# Patient Record
Sex: Male | Born: 1980 | Race: White | Hispanic: No | Marital: Single | Smoking: Current every day smoker
Health system: Southern US, Community
[De-identification: ages and names within clinical notes are randomized; demographics above are authoritative.]

## PROBLEM LIST (undated history)

## (undated) DIAGNOSIS — N289 Disorder of kidney and ureter, unspecified: Secondary | ICD-10-CM

---

## 2004-10-11 ENCOUNTER — Emergency Department: Payer: Self-pay | Admitting: Emergency Medicine

## 2005-02-15 ENCOUNTER — Emergency Department: Payer: Self-pay | Admitting: Emergency Medicine

## 2005-02-22 ENCOUNTER — Emergency Department: Payer: Self-pay | Admitting: Emergency Medicine

## 2005-04-01 ENCOUNTER — Emergency Department: Payer: Self-pay | Admitting: General Practice

## 2005-04-22 ENCOUNTER — Emergency Department: Payer: Self-pay | Admitting: General Practice

## 2005-05-17 ENCOUNTER — Emergency Department: Payer: Self-pay | Admitting: Internal Medicine

## 2005-05-19 ENCOUNTER — Emergency Department: Payer: Self-pay | Admitting: Emergency Medicine

## 2005-05-23 ENCOUNTER — Emergency Department: Payer: Self-pay | Admitting: Emergency Medicine

## 2005-06-16 ENCOUNTER — Emergency Department: Payer: Self-pay | Admitting: Emergency Medicine

## 2005-07-09 ENCOUNTER — Emergency Department: Payer: Self-pay | Admitting: Unknown Physician Specialty

## 2005-07-30 ENCOUNTER — Emergency Department: Payer: Self-pay | Admitting: Emergency Medicine

## 2005-08-06 ENCOUNTER — Emergency Department: Payer: Self-pay | Admitting: General Practice

## 2005-08-19 ENCOUNTER — Emergency Department: Payer: Self-pay | Admitting: Emergency Medicine

## 2005-09-16 ENCOUNTER — Emergency Department: Payer: Self-pay | Admitting: Emergency Medicine

## 2005-09-22 ENCOUNTER — Emergency Department: Payer: Self-pay | Admitting: Emergency Medicine

## 2005-11-11 ENCOUNTER — Emergency Department: Payer: Self-pay | Admitting: Emergency Medicine

## 2006-01-26 ENCOUNTER — Emergency Department: Payer: Self-pay | Admitting: Emergency Medicine

## 2006-02-07 ENCOUNTER — Emergency Department: Payer: Self-pay | Admitting: Emergency Medicine

## 2006-02-09 ENCOUNTER — Emergency Department: Payer: Self-pay | Admitting: Emergency Medicine

## 2007-04-16 ENCOUNTER — Emergency Department: Payer: Self-pay | Admitting: Emergency Medicine

## 2007-05-07 ENCOUNTER — Emergency Department: Payer: Self-pay | Admitting: General Practice

## 2007-05-12 ENCOUNTER — Emergency Department: Payer: Self-pay | Admitting: Emergency Medicine

## 2008-05-17 ENCOUNTER — Emergency Department: Payer: Self-pay | Admitting: Emergency Medicine

## 2008-05-19 ENCOUNTER — Emergency Department: Payer: Self-pay | Admitting: Emergency Medicine

## 2008-08-08 ENCOUNTER — Emergency Department: Payer: Self-pay | Admitting: Emergency Medicine

## 2008-10-20 ENCOUNTER — Emergency Department: Payer: Self-pay | Admitting: Emergency Medicine

## 2008-12-03 ENCOUNTER — Emergency Department: Payer: Self-pay | Admitting: Emergency Medicine

## 2008-12-30 ENCOUNTER — Emergency Department: Payer: Self-pay | Admitting: Emergency Medicine

## 2009-03-04 ENCOUNTER — Emergency Department: Payer: Self-pay | Admitting: Emergency Medicine

## 2009-03-09 ENCOUNTER — Emergency Department: Payer: Self-pay | Admitting: Emergency Medicine

## 2009-03-12 ENCOUNTER — Emergency Department: Payer: Self-pay | Admitting: Emergency Medicine

## 2009-05-18 ENCOUNTER — Emergency Department: Payer: Self-pay | Admitting: Emergency Medicine

## 2009-07-22 ENCOUNTER — Emergency Department: Payer: Self-pay | Admitting: Emergency Medicine

## 2009-10-13 ENCOUNTER — Emergency Department: Payer: Self-pay | Admitting: Emergency Medicine

## 2009-10-19 ENCOUNTER — Emergency Department: Payer: Self-pay | Admitting: Emergency Medicine
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
## 2010-01-17 ENCOUNTER — Emergency Department: Payer: Self-pay | Admitting: Emergency Medicine

## 2010-07-06 ENCOUNTER — Emergency Department: Payer: Self-pay | Admitting: Emergency Medicine

## 2010-10-22 ENCOUNTER — Emergency Department: Payer: Self-pay | Admitting: Unknown Physician Specialty

## 2010-11-04 ENCOUNTER — Emergency Department: Payer: Self-pay | Admitting: Unknown Physician Specialty

## 2010-11-26 ENCOUNTER — Emergency Department: Payer: Self-pay | Admitting: Emergency Medicine

## 2010-12-25 ENCOUNTER — Emergency Department: Payer: Self-pay | Admitting: Emergency Medicine

## 2013-01-05 ENCOUNTER — Emergency Department: Payer: Self-pay | Admitting: Emergency Medicine

## 2013-02-01 ENCOUNTER — Emergency Department: Payer: Self-pay | Admitting: Internal Medicine

## 2013-04-29 ENCOUNTER — Emergency Department (HOSPITAL_COMMUNITY): Payer: Self-pay

## 2013-04-29 ENCOUNTER — Encounter (HOSPITAL_COMMUNITY): Payer: Self-pay

## 2013-04-29 ENCOUNTER — Emergency Department (HOSPITAL_COMMUNITY)
Admission: EM | Admit: 2013-04-29 | Discharge: 2013-04-29 | Disposition: A | Discharge status: Home / Self Care | Payer: Self-pay | Attending: Emergency Medicine | Admitting: Emergency Medicine

## 2013-04-29 DIAGNOSIS — M549 Dorsalgia, unspecified: Secondary | ICD-10-CM

## 2013-04-29 DIAGNOSIS — M545 Low back pain, unspecified: Secondary | ICD-10-CM | POA: Insufficient documentation

## 2013-04-29 DIAGNOSIS — R109 Unspecified abdominal pain: Secondary | ICD-10-CM | POA: Insufficient documentation

## 2013-04-29 DIAGNOSIS — R300 Dysuria: Secondary | ICD-10-CM | POA: Insufficient documentation

## 2013-04-29 DIAGNOSIS — R110 Nausea: Secondary | ICD-10-CM | POA: Insufficient documentation

## 2013-04-29 DIAGNOSIS — Z87442 Personal history of urinary calculi: Secondary | ICD-10-CM | POA: Insufficient documentation

## 2013-04-29 HISTORY — DX: Disorder of kidney and ureter, unspecified: N28.9

## 2013-04-29 LAB — CBC WITH DIFFERENTIAL/PLATELET
Hemoglobin: 16.4 g/dL (ref 13.0–17.0)
Lymphocytes Relative: 31 % (ref 12–46)
Lymphs Abs: 2.7 10*3/uL (ref 0.7–4.0)
MCH: 32 pg (ref 26.0–34.0)
Monocytes Relative: 5 % (ref 3–12)
Neutro Abs: 5.1 10*3/uL (ref 1.7–7.7)
Neutrophils Relative %: 60 % (ref 43–77)
RBC: 5.13 MIL/uL (ref 4.22–5.81)
WBC: 8.6 10*3/uL (ref 4.0–10.5)

## 2013-04-29 LAB — BASIC METABOLIC PANEL
BUN: 18 mg/dL (ref 6–23)
CO2: 29 mEq/L (ref 19–32)
Chloride: 97 mEq/L (ref 96–112)
Glucose, Bld: 103 mg/dL — ABNORMAL HIGH (ref 70–99)
Potassium: 4 mEq/L (ref 3.5–5.1)

## 2013-04-29 LAB — URINALYSIS, ROUTINE W REFLEX MICROSCOPIC
Bilirubin Urine: NEGATIVE
Leukocytes, UA: NEGATIVE
Nitrite: NEGATIVE
Specific Gravity, Urine: 1.02 (ref 1.005–1.030)
pH: 7 (ref 5.0–8.0)

## 2013-04-29 MED ORDER — OXYCODONE-ACETAMINOPHEN 5-325 MG PO TABS: 1.0000 | ORAL_TABLET | Freq: Four times a day (QID) | ORAL | Status: DC | PRN

## 2013-04-29 MED ORDER — OXYCODONE-ACETAMINOPHEN 5-325 MG PO TABS
1.0000 | ORAL_TABLET | Freq: Once | ORAL | Status: AC
  Administered 2013-04-29: 1 via ORAL
  Filled 2013-04-29: qty 1

## 2013-04-29 MED ORDER — POLYETHYLENE GLYCOL 3350 17 GM/SCOOP PO POWD: 17.0000 g | Freq: Every day | ORAL | Status: DC

## 2013-04-29 NOTE — ED Notes (Signed)
Pt reports lower back pain for past few days and trouble urinating.  Reports history of kidney stones.

## 2013-04-29 NOTE — ED Provider Notes (Signed)
History    This chart was scribed for [employer]. Rubin Payor, MD by Quintella Reichert, ED scribe.  This patient was seen in room APA08/APA08 and the patient's care was started at 6:27 PM.   CSN: [id number]  Arrival date & time 04/29/13  1741       Chief Complaint  Patient presents with  . Back Pain     The history is provided by the patient. No language interpreter was used.    HPI Comments: Kenneth Carey is a 32 y.o. male who presents to the Emergency Department complaining of intermittent, gradually-worsening, moderate lower back pain that began 2 days ago, with accomapnying difficulty urinating and dysuria that began yesterday.  Pt describes back pain as burning and stinging, and exacerbated by movement.   He denies injury or other activities that may have caused pain.  He states that yesterday he felt the need to urinate but only "a dribble came out," accompanied by a burning sensation.  He also reports mild nausea on waking this morning.  He denies emesis, fever, chills, or any other associated symptoms.  Pt has h/o kidney stones and states present symptoms feel similar.  He states last stone was 2-3 years ago and he was seen in [place].  He states that a CT-scan taken on that occasion showed additional stones. Denies h/o any other back problems.  He denies any other medical problems.  Pt states he is not driving himself home from ED.   Past Medical History  Diagnosis Date  . Renal disorder     kidney stones    History reviewed. No pertinent past surgical history.  No family history on file.  History  Substance Use Topics  . Smoking status: Not on file  . Smokeless tobacco: Not on file  . Alcohol Use: Not on file      Review of Systems  Constitutional: Negative for fever and chills.  HENT: Negative for sore throat and rhinorrhea.   Respiratory: Negative for cough and shortness of breath.   Cardiovascular: Negative for chest pain and leg swelling.   Gastrointestinal: Positive for nausea. Negative for vomiting.  Genitourinary: Positive for dysuria and difficulty urinating. Negative for hematuria.  Musculoskeletal: Positive for back pain. Negative for joint swelling.  Skin: Negative for rash and wound.  Neurological: Negative for dizziness, weakness and numbness.  Psychiatric/Behavioral: Negative for behavioral problems and confusion.    Allergies  Review of patient's allergies indicates no known allergies.  Home Medications   Current Outpatient Rx  Name  Route  Sig  Dispense  Refill  . oxyCODONE-acetaminophen (PERCOCET/ROXICET) 5-325 MG per tablet   Oral   Take 1-2 tablets by mouth every 6 (six) hours as needed for pain.   10 tablet   0   . polyethylene glycol powder (GLYCOLAX/MIRALAX) powder   Oral   Take 17 g by mouth daily.   255 g   0     BP 142/67  Pulse 79  Temp(Src) 98.3 F (36.8 C) (Oral)  Resp 20  Ht 5\' 11"  (1.803 m)  Wt 173 lb (78.472 kg)  BMI 24.14 kg/m2  SpO2 99%  Physical Exam  Nursing note and vitals reviewed. Constitutional: He is oriented to person, place, and time. He appears well-developed and well-nourished. No distress.  HENT:  Head: Normocephalic and atraumatic.  Eyes: Conjunctivae and EOM are normal.  Cardiovascular: Normal rate, regular rhythm and normal heart sounds.   No murmur heard. Pulmonary/Chest: Effort normal. No respiratory distress.  Genitourinary:  No testicular tenderness, no mass. No hernias on palpation.  Musculoskeletal: Normal range of motion. He exhibits no tenderness.  No CVA tenderness  Neurological: He is alert and oriented to person, place, and time. Coordination normal.  Skin: Skin is warm and dry. No rash noted.  Psychiatric: He has a normal mood and affect. His behavior is normal.    ED Course  Procedures (including critical care time)  6:32 PM-Discussed treatment plan which includes labs, imaging and pain medication with pt at bedside and pt agreed to  plan.    Results for orders placed during the hospital encounter of 04/29/13  URINALYSIS, ROUTINE W REFLEX MICROSCOPIC      Result Value Range   Color, Urine YELLOW  YELLOW   APPearance CLEAR  CLEAR   Specific Gravity, Urine 1.020  1.005 - 1.030   pH 7.0  5.0 - 8.0   Glucose, UA NEGATIVE  NEGATIVE mg/dL   Hgb urine dipstick NEGATIVE  NEGATIVE   Bilirubin Urine NEGATIVE  NEGATIVE   Ketones, ur NEGATIVE  NEGATIVE mg/dL   Protein, ur NEGATIVE  NEGATIVE mg/dL   Urobilinogen, UA 0.2  0.0 - 1.0 mg/dL   Nitrite NEGATIVE  NEGATIVE   Leukocytes, UA NEGATIVE  NEGATIVE  CBC WITH DIFFERENTIAL      Result Value Range   WBC 8.6  4.0 - 10.5 K/uL   RBC 5.13  4.22 - 5.81 MIL/uL   Hemoglobin 16.4  13.0 - 17.0 g/dL   HCT 16.1  09.6 - 04.5 %   MCV 92.4  78.0 - 100.0 fL   MCH 32.0  26.0 - 34.0 pg   MCHC 34.6  30.0 - 36.0 g/dL   RDW 40.9  81.1 - 91.4 %   Platelets 297  150 - 400 K/uL   Neutrophils Relative % 60  43 - 77 %   Neutro Abs 5.1  1.7 - 7.7 K/uL   Lymphocytes Relative 31  12 - 46 %   Lymphs Abs 2.7  0.7 - 4.0 K/uL   Monocytes Relative 5  3 - 12 %   Monocytes Absolute 0.4  0.1 - 1.0 K/uL   Eosinophils Relative 4  0 - 5 %   Eosinophils Absolute 0.4  0.0 - 0.7 K/uL   Basophils Relative 0  0 - 1 %   Basophils Absolute 0.0  0.0 - 0.1 K/uL  BASIC METABOLIC PANEL      Result Value Range   Sodium 135  135 - 145 mEq/L   Potassium 4.0  3.5 - 5.1 mEq/L   Chloride 97  96 - 112 mEq/L   CO2 29  19 - 32 mEq/L   Glucose, Bld 103 (*) 70 - 99 mg/dL   BUN 18  6 - 23 mg/dL   Creatinine, Ser 7.82  0.50 - 1.35 mg/dL   Calcium 9.7  8.4 - 95.6 mg/dL   GFR calc non Af Amer >90  >90 mL/min   GFR calc Af Amer >90  >90 mL/min   Dg Abd 1 View  04/29/2013   *RADIOLOGY REPORT*  Clinical Data: Right flank pain  ABDOMEN - 1 VIEW  Comparison: None.  Findings: Nonobstructive bowel gas pattern.  No osseous abnormality.  No definite radiopaque calculi by plain radiography. Retained stool throughout the colon.   Ingested radiopaque material in the left descending colon.  IMPRESSION: No acute finding by plain radiography   Original Report Authenticated By: Judie Petit. Shick, M.D.      1. Back pain  MDM  Patient with right-sided flank and back pain. States feels somewhat like a previous kidney stones. Patient states he is previously seen at [facility] a few years ago, however I cannot see the records in care everywhere. He does not have hematuria here. No testicular tenderness. KUB only showed some stool. This point it does not feel he needs another CT. Will be given pain medicines and will followup as needed.      *I personally performed the services described in this documentation, which was scribed in my presence. The recorded information has been reviewed and is accurate.     Juliet Rude. Rubin Payor, MD 04/29/13 2022

## 2013-04-29 NOTE — ED Notes (Signed)
Pt c/o lower back x2-3 days along with difficulty urinating. Pt has hx of kidney stones and states this feels similar to previous episodes. Pt denies hematuria.

## 2014-09-18 ENCOUNTER — Encounter (HOSPITAL_COMMUNITY): Payer: Self-pay | Admitting: Emergency Medicine

## 2014-09-18 ENCOUNTER — Emergency Department (HOSPITAL_COMMUNITY): Payer: Self-pay

## 2014-09-18 ENCOUNTER — Emergency Department (HOSPITAL_COMMUNITY)
Admission: EM | Admit: 2014-09-18 | Discharge: 2014-09-18 | Disposition: A | Discharge status: Home / Self Care | Payer: Self-pay | Attending: Emergency Medicine | Admitting: Emergency Medicine

## 2014-09-18 DIAGNOSIS — R6883 Chills (without fever): Secondary | ICD-10-CM | POA: Insufficient documentation

## 2014-09-18 DIAGNOSIS — M545 Low back pain, unspecified: Secondary | ICD-10-CM

## 2014-09-18 DIAGNOSIS — Z720 Tobacco use: Secondary | ICD-10-CM | POA: Insufficient documentation

## 2014-09-18 DIAGNOSIS — N200 Calculus of kidney: Secondary | ICD-10-CM | POA: Insufficient documentation

## 2014-09-18 DIAGNOSIS — R109 Unspecified abdominal pain: Secondary | ICD-10-CM

## 2014-09-18 DIAGNOSIS — Z79899 Other long term (current) drug therapy: Secondary | ICD-10-CM | POA: Insufficient documentation

## 2014-09-18 LAB — URINALYSIS, ROUTINE W REFLEX MICROSCOPIC
Bilirubin Urine: NEGATIVE
GLUCOSE, UA: NEGATIVE mg/dL
KETONES UR: NEGATIVE mg/dL
LEUKOCYTES UA: NEGATIVE
NITRITE: NEGATIVE
PROTEIN: NEGATIVE mg/dL
Specific Gravity, Urine: <1.005 — ABNORMAL LOW (ref 1.005–1.030)
UROBILINOGEN UA: 0.2 mg/dL (ref 0.0–1.0)
pH: 6.5 (ref 5.0–8.0)

## 2014-09-18 LAB — URINE MICROSCOPIC-ADD ON

## 2014-09-18 MED ORDER — DICLOFENAC SODIUM 75 MG PO TBEC: 75.0000 mg | DELAYED_RELEASE_TABLET | Freq: Two times a day (BID) | ORAL | Status: DC

## 2014-09-18 MED ORDER — HYDROCODONE-ACETAMINOPHEN 5-325 MG PO TABS: 1.0000 | ORAL_TABLET | ORAL | Status: DC | PRN

## 2014-09-18 MED ORDER — HYDROCODONE-ACETAMINOPHEN 5-325 MG PO TABS
2.0000 | ORAL_TABLET | Freq: Once | ORAL | Status: AC
  Administered 2014-09-18: 2 via ORAL
  Filled 2014-09-18: qty 2

## 2014-09-18 MED ORDER — DIAZEPAM 5 MG PO TABS
5.0000 mg | ORAL_TABLET | Freq: Once | ORAL | Status: AC
  Administered 2014-09-18: 5 mg via ORAL
  Filled 2014-09-18: qty 1

## 2014-09-18 MED ORDER — KETOROLAC TROMETHAMINE 10 MG PO TABS
10.0000 mg | ORAL_TABLET | Freq: Once | ORAL | Status: AC
  Administered 2014-09-18: 10 mg via ORAL
  Filled 2014-09-18: qty 1

## 2014-09-18 NOTE — Discharge Instructions (Signed)
Your CT scan shows stones in both kidneys, but none in the ureters. Please strain all urine over the next 2 or 3 weeks. Please see the urology specialist listed above concerning your stones and symptoms. Please use diclofenac 2 times daily, and Norco if needed for pain. Norco may cause drowsiness, please use with caution. Kidney Stones Kidney stones (urolithiasis) are deposits that form inside your kidneys. The intense pain is caused by the stone moving through the urinary tract. When the stone moves, the ureter goes into spasm around the stone. The stone is usually passed in the urine.  CAUSES   A disorder that makes certain neck glands produce too much parathyroid hormone (primary hyperparathyroidism).  A buildup of uric acid crystals, similar to gout in your joints.  Narrowing (stricture) of the ureter.  A kidney obstruction present at birth (congenital obstruction).  Previous surgery on the kidney or ureters.  Numerous kidney infections. SYMPTOMS   Feeling sick to your stomach (nauseous).  Throwing up (vomiting).  Blood in the urine (hematuria).  Pain that usually spreads (radiates) to the groin.  Frequency or urgency of urination. DIAGNOSIS   Taking a history and physical exam.  Blood or urine tests.  CT scan.  Occasionally, an examination of the inside of the urinary bladder (cystoscopy) is performed. TREATMENT   Observation.  Increasing your fluid intake.  Extracorporeal shock wave lithotripsy--This is a noninvasive procedure that uses shock waves to break up kidney stones.  Surgery may be needed if you have severe pain or persistent obstruction. There are various surgical procedures. Most of the procedures are performed with the use of small instruments. Only small incisions are needed to accommodate these instruments, so recovery time is minimized. The size, location, and chemical composition are all important variables that will determine the proper choice of  action for you. Talk to your health care provider to better understand your situation so that you will minimize the risk of injury to yourself and your kidney.  HOME CARE INSTRUCTIONS   Drink enough water and fluids to keep your urine clear or pale yellow. This will help you to pass the stone or stone fragments.  Strain all urine through the provided strainer. Keep all particulate matter and stones for your health care provider to see. The stone causing the pain may be as small as a grain of salt. It is very important to use the strainer each and every time you pass your urine. The collection of your stone will allow your health care provider to analyze it and verify that a stone has actually passed. The stone analysis will often identify what you can do to reduce the incidence of recurrences.  Only take over-the-counter or prescription medicines for pain, discomfort, or fever as directed by your health care provider.  Make a follow-up appointment with your health care provider as directed.  Get follow-up X-rays if required. The absence of pain does not always mean that the stone has passed. It may have only stopped moving. If the urine remains completely obstructed, it can cause loss of kidney function or even complete destruction of the kidney. It is your responsibility to make sure X-rays and follow-ups are completed. Ultrasounds of the kidney can show blockages and the status of the kidney. Ultrasounds are not associated with any radiation and can be performed easily in a matter of minutes. SEEK MEDICAL CARE IF:  You experience pain that is progressive and unresponsive to any pain medicine you have been prescribed. SEEK  IMMEDIATE MEDICAL CARE IF:   Pain cannot be controlled with the prescribed medicine.  You have a fever or shaking chills.  The severity or intensity of pain increases over 18 hours and is not relieved by pain medicine.  You develop a new onset of abdominal pain.  You feel  faint or pass out.  You are unable to urinate. MAKE SURE YOU:   Understand these instructions.  Will watch your condition.  Will get help right away if you are not doing well or get worse. Document Released: 11/20/2005 Document Revised: 07/23/2013 Document Reviewed: 04/23/2013 [facility] LP[facility] Patient Information 2015 [place], [place]. This information is not intended to replace advice given to you by your health care provider. Make sure you discuss any questions you have with your health care provider.

## 2014-09-18 NOTE — ED Notes (Signed)
Patient with no complaints at this time. Respirations even and unlabored. Skin warm/dry. Discharge instructions reviewed with patient at this time. Patient given opportunity to voice concerns/ask questions. Patient discharged at this time and left Emergency Department with steady gait.   

## 2014-09-18 NOTE — ED Provider Notes (Signed)
CSN: [id number]     Arrival date & time 09/18/14  1004 History   First MD Initiated Contact with Patient 09/18/14 1059     Chief Complaint  Patient presents with  . Back Pain     (Consider location/radiation/quality/duration/timing/severity/associated sxs/prior Treatment) Patient is a 33 y.o. male presenting with dysuria. The history is provided by the patient.  Dysuria This is a new problem. The current episode started in the past 7 days. The problem occurs intermittently. The problem has been gradually worsening. Associated symptoms include chills. Pertinent negatives include no abdominal pain, arthralgias, chest pain, coughing, nausea, neck pain or vomiting. Associated symptoms comments: Back pain. Nothing aggravates the symptoms. He has tried nothing for the symptoms. The treatment provided no relief.    Past Medical History  Diagnosis Date  . Renal disorder     kidney stones   History reviewed. No pertinent past surgical history. History reviewed. No pertinent family history. History  Substance Use Topics  . Smoking status: Current Every Day Smoker -- 0.50 packs/day  . Smokeless tobacco: Not on file  . Alcohol Use: Yes     Comment: occ    Review of Systems  Constitutional: Positive for chills. Negative for activity change.       All ROS Neg except as noted in HPI  Eyes: Negative for photophobia and discharge.  Respiratory: Negative for cough, shortness of breath and wheezing.   Cardiovascular: Negative for chest pain and palpitations.  Gastrointestinal: Negative for nausea, vomiting, abdominal pain and blood in stool.  Genitourinary: Positive for dysuria. Negative for frequency and hematuria.  Musculoskeletal: Positive for back pain. Negative for arthralgias and neck pain.  Skin: Negative.   Neurological: Negative for dizziness, seizures and speech difficulty.  Psychiatric/Behavioral: Negative for hallucinations and confusion.      Allergies  Review of patient's  allergies indicates no known allergies.  Home Medications   Prior to Admission medications   Medication Sig Start Date End Date Taking? Authorizing Provider  oxyCODONE-acetaminophen (PERCOCET/ROXICET) 5-325 MG per tablet Take 1-2 tablets by mouth every 6 (six) hours as needed for pain. 04/29/13   Juliet RudeNathan R. Pickering, MD  polyethylene glycol powder (GLYCOLAX/MIRALAX) powder Take 17 g by mouth daily. 04/29/13   Juliet RudeNathan R. Pickering, MD   BP 117/73  Pulse 87  Temp(Src) 98.3 F (36.8 C) (Oral)  Resp 18  Ht 5\' 11"  (1.803 m)  Wt 185 lb (83.915 kg)  BMI 25.81 kg/m2  SpO2 100% Physical Exam  Nursing note and vitals reviewed. Constitutional: He is oriented to person, place, and time. He appears well-developed and well-nourished.  Non-toxic appearance.  HENT:  Head: Normocephalic.  Right Ear: Tympanic membrane and external ear normal.  Left Ear: Tympanic membrane and external ear normal.  Eyes: EOM and lids are normal. Pupils are equal, round, and reactive to light.  Neck: Normal range of motion. Neck supple. Carotid bruit is not present.  Cardiovascular: Normal rate, regular rhythm, normal heart sounds, intact distal pulses and normal pulses.   Pulmonary/Chest: Breath sounds normal. No respiratory distress.  Abdominal: Soft. Bowel sounds are normal. There is no tenderness. There is CVA tenderness. There is no guarding.  Mild CVAT right  Musculoskeletal: Normal range of motion.       Lumbar back: He exhibits tenderness. He exhibits normal range of motion and no spasm.  Lymphadenopathy:       Head (right side): No submandibular adenopathy present.       Head (left side): No submandibular adenopathy present.  He has no cervical adenopathy.  Neurological: He is alert and oriented to person, place, and time. He has normal strength. No cranial nerve deficit or sensory deficit.  Skin: Skin is warm and dry.  Psychiatric: He has a normal mood and affect. His speech is normal.    ED Course   Procedures (including critical care time) Labs Review Labs Reviewed  URINALYSIS, ROUTINE W REFLEX MICROSCOPIC    Imaging Review No results found.   EKG Interpretation None      MDM  Vital signs are well within normal limits.  Urinalysis shows a large hemoglobin was too many to count red cells. CT scan of the abdomen and pelvis without contrast reveals bilateral nephrolithiasis. No ureteral lithiasis or obstruction noted. No bladder abnormality.   Examination and test results discussed with the patient in terms which he understands. Patient is referred to [facility]. He is provided a strainer. Prescription for diclofenac and Norco given to the patient.    Final diagnoses:  Nephrolithiasis  Left-sided low back pain without sciatica    *I have reviewed nursing notes, vital signs, and all appropriate lab and imaging results for this patient.Kathie Dike**    Rajat Staver M Deroy Noah, PA-C 09/22/14 1301

## 2014-09-18 NOTE — ED Notes (Signed)
Pt reports lower back pain,dysuria, and difficulty initiating urine stream x2 days. Pt denies any abdominal pain,n/v.

## 2014-09-24 NOTE — ED Provider Notes (Signed)
Medical screening examination/treatment/procedure(s) were performed by non-physician practitioner and as supervising physician I was immediately available for consultation/collaboration.   EKG Interpretation None        Lasonja Lakins, MD 09/24/14 2311 

## 2016-11-08 ENCOUNTER — Encounter: Payer: Self-pay | Admitting: Emergency Medicine

## 2016-11-08 ENCOUNTER — Emergency Department: Payer: Self-pay

## 2016-11-08 ENCOUNTER — Emergency Department
Admission: EM | Admit: 2016-11-08 | Discharge: 2016-11-09 | Disposition: A | Discharge status: Home / Self Care | Payer: Self-pay | Attending: Emergency Medicine | Admitting: Emergency Medicine

## 2016-11-08 DIAGNOSIS — T401X1A Poisoning by heroin, accidental (unintentional), initial encounter: Secondary | ICD-10-CM | POA: Insufficient documentation

## 2016-11-08 DIAGNOSIS — Z79899 Other long term (current) drug therapy: Secondary | ICD-10-CM | POA: Insufficient documentation

## 2016-11-08 DIAGNOSIS — F17200 Nicotine dependence, unspecified, uncomplicated: Secondary | ICD-10-CM | POA: Insufficient documentation

## 2016-11-08 DIAGNOSIS — Z791 Long term (current) use of non-steroidal anti-inflammatories (NSAID): Secondary | ICD-10-CM | POA: Insufficient documentation

## 2016-11-08 MED ORDER — NALOXONE HCL 4 MG/0.1ML NA LIQD: NASAL | 1 refills | Status: DC
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
## 2016-11-09 NOTE — ED Provider Notes (Signed)
4 hours after Narcan ingestion patient is still awake alert, ambulatory normal vital signs vigorous and energetic. Clinically sober, no evidence of recurrent intoxication or altered level of consciousness. Medically stable. Discharge.   Sharman CheekPhillip Dustine Bertini, MD 11/09/16 [phone]

## 2016-11-09 NOTE — ED Notes (Signed)
Pt's IV removed per MD

## 2016-11-09 NOTE — ED Notes (Signed)
Pt pulled off all cardiac leads and monitoring leads and was trying to leave. Pt given graham crackers and soda and told he needed to wait until 1am to be discharged.

## 2017-06-25 ENCOUNTER — Emergency Department
Admission: EM | Admit: 2017-06-25 | Discharge: 2017-06-25 | Disposition: A | Discharge status: Home / Self Care | Payer: Self-pay | Attending: Emergency Medicine | Admitting: Emergency Medicine

## 2017-06-25 ENCOUNTER — Emergency Department: Payer: Self-pay

## 2017-06-25 ENCOUNTER — Encounter: Payer: Self-pay | Admitting: Emergency Medicine

## 2017-06-25 DIAGNOSIS — Y999 Unspecified external cause status: Secondary | ICD-10-CM | POA: Insufficient documentation

## 2017-06-25 DIAGNOSIS — S0990XA Unspecified injury of head, initial encounter: Secondary | ICD-10-CM | POA: Insufficient documentation

## 2017-06-25 DIAGNOSIS — F17200 Nicotine dependence, unspecified, uncomplicated: Secondary | ICD-10-CM | POA: Insufficient documentation

## 2017-06-25 DIAGNOSIS — Y929 Unspecified place or not applicable: Secondary | ICD-10-CM | POA: Insufficient documentation

## 2017-06-25 DIAGNOSIS — Y939 Activity, unspecified: Secondary | ICD-10-CM | POA: Insufficient documentation

## 2017-06-25 DIAGNOSIS — S41112A Laceration without foreign body of left upper arm, initial encounter: Secondary | ICD-10-CM | POA: Insufficient documentation

## 2017-06-25 DIAGNOSIS — Z79899 Other long term (current) drug therapy: Secondary | ICD-10-CM | POA: Insufficient documentation

## 2017-06-25 DIAGNOSIS — Z791 Long term (current) use of non-steroidal anti-inflammatories (NSAID): Secondary | ICD-10-CM | POA: Insufficient documentation

## 2017-06-25 MED ORDER — LIDOCAINE HCL (PF) 1 % IJ SOLN
20.0000 mL | Freq: Once | INTRAMUSCULAR | Status: AC
  Administered 2017-06-25: 20 mL via INTRADERMAL

## 2017-06-25 MED ORDER — LIDOCAINE HCL (PF) 1 % IJ SOLN
INTRAMUSCULAR | Status: AC
  Administered 2017-06-25: 20 mL via INTRADERMAL
  Filled 2017-06-25: qty 20

## 2017-06-25 MED ORDER — MORPHINE SULFATE (PF) 2 MG/ML IV SOLN
2.0000 mg | Freq: Once | INTRAVENOUS | Status: DC
Start: 2017-06-25 — End: 2017-06-25
  Filled 2017-06-25: qty 1

## 2017-06-25 MED ORDER — MORPHINE SULFATE (PF) 2 MG/ML IV SOLN
2.0000 mg | Freq: Once | INTRAVENOUS | Status: AC
  Administered 2017-06-25: 2 mg via INTRAMUSCULAR

## 2017-06-25 MED ORDER — LIDOCAINE HCL (PF) 1 % IJ SOLN
INTRAMUSCULAR | Status: AC
  Filled 2017-06-25: qty 20

## 2017-06-25 MED ORDER — OXYCODONE-ACETAMINOPHEN 5-325 MG PO TABS
1.0000 | ORAL_TABLET | Freq: Once | ORAL | Status: AC
Start: 2017-06-25 — End: 2017-06-25
  Administered 2017-06-25: 1 via ORAL
  Filled 2017-06-25: qty 1

## 2017-06-25 MED ORDER — TETANUS-DIPHTH-ACELL PERTUSSIS 5-2.5-18.5 LF-MCG/0.5 IM SUSP
0.5000 mL | Freq: Once | INTRAMUSCULAR | Status: AC
  Administered 2017-06-25: 0.5 mL via INTRAMUSCULAR
  Filled 2017-06-25: qty 0.5

## 2017-06-25 MED ORDER — OXYCODONE-ACETAMINOPHEN 5-325 MG PO TABS: 1.0000 | ORAL_TABLET | Freq: Once | ORAL | Status: DC

## 2017-06-25 NOTE — ED Notes (Signed)
Laceration to left arm. Good radial pulses noted. Patient able to move extremity and fingers.

## 2017-06-25 NOTE — ED Notes (Signed)
This RN to bedside, introduced self to patient. Pt is noted to be alert, requesting to see his mom. This RN informed by Diplomatic Services operational officersecretary that patient's mother called but was not on premises, pt given phone to call his mom from phone number given to this RN by Diplomatic Services operational officersecretary. Pt is noted to be standing in doorway using phone at this time.
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
## 2017-06-25 NOTE — ED Notes (Signed)
Pt resting in bed at this time on the phone. Pt is alert and oriented. Pt is noted to be calm and cooperative at this time. Will continue to monitor for further patient needs at this time.
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
## 2017-06-25 NOTE — ED Notes (Signed)
NAD noted at time of D/C. Pt denies questions or concerns. Pt ambulatory to the lobby at this time. Pt's friend in lobby for discharge.
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
## 2018-08-14 ENCOUNTER — Other Ambulatory Visit: Payer: Self-pay

## 2018-08-14 ENCOUNTER — Emergency Department
Admission: EM | Admit: 2018-08-14 | Discharge: 2018-08-14 | Disposition: A | Discharge status: Home / Self Care | Payer: Self-pay | Attending: Emergency Medicine | Admitting: Emergency Medicine

## 2018-08-14 ENCOUNTER — Encounter: Payer: Self-pay | Admitting: Emergency Medicine

## 2018-08-14 DIAGNOSIS — K029 Dental caries, unspecified: Secondary | ICD-10-CM | POA: Insufficient documentation

## 2018-08-14 MED ORDER — CEPHALEXIN 500 MG PO CAPS: 500.0000 mg | ORAL_CAPSULE | Freq: Three times a day (TID) | ORAL | 0 refills | Status: DC

## 2018-08-14 MED ORDER — IBUPROFEN 800 MG PO TABS: 800.0000 mg | ORAL_TABLET | Freq: Three times a day (TID) | ORAL | 0 refills | Status: DC | PRN

## 2018-08-14 NOTE — ED Notes (Signed)
See triage note   Presents with possible dental abscess   States sx's started 2 days ago

## 2018-08-14 NOTE — ED Triage Notes (Signed)
Pt to ED via POV with c/o dental pain from abscess x2 days, denies fever. VSS

## 2018-08-14 NOTE — ED Provider Notes (Signed)
[facility] Emergency Department Provider Note  ____________________________________________   First MD Initiated Contact with Patient 08/14/18 1545     (approximate)  I have reviewed the triage vital signs and the nursing notes.   HISTORY  Chief Complaint Dental Pain    HPI Kenneth Carey is a 37 y.o. male presents emergency department complaining of right upper molar pain.  States the tooth broke off about 2 to 2 years ago and now he started having severe pain in this area.  He denies any fever or chills.    Past Medical History:  Diagnosis Date  . Renal disorder    kidney stones    There are no active problems to display for this patient.   History reviewed. No pertinent surgical history.  Prior to Admission medications   Medication Sig Start Date End Date Taking? Authorizing Provider  cephALEXin (KEFLEX) 500 MG capsule Take 1 capsule (500 mg total) by mouth 3 (three) times daily. 08/14/18   Fisher, Roselyn Bering, PA-C  ibuprofen (ADVIL,MOTRIN) 800 MG tablet Take 1 tablet (800 mg total) by mouth every 8 (eight) hours as needed. 08/14/18   Sherrie Mustache Roselyn Bering, PA-C  naloxone [facility]) nasal spray 4 mg/0.1 mL Apply to the nose if patient is not breathing or used too much heroin 11/08/16   Nita Sickle, MD    Allergies Toradol [ketorolac tromethamine] and Penicillins  No family history on file.  Social History Social History   Tobacco Use  . Smoking status: Current Every Day Smoker    Packs/day: 0.50  . Smokeless tobacco: Never Used  Substance Use Topics  . Alcohol use: Yes    Comment: occ  . Drug use: No    Review of Systems  Constitutional: No fever/chills Eyes: No visual changes. ENT: No sore throat.  Positive for dental pain Respiratory: Denies cough Cardiovascular.  Denies chest pain shortness of breath Genitourinary: Negative for dysuria. Musculoskeletal: Negative for back pain. Skin: Negative for  rash.    ____________________________________________   PHYSICAL EXAM:  VITAL SIGNS: ED Triage Vitals [08/14/18 1529]  Enc Vitals Group     BP (!) 142/77     Pulse Rate 94     Resp 16     Temp 98.2 F (36.8 C)     Temp Source Oral     SpO2 97 %     Weight 190 lb (86.2 kg)     Height 5\' 11"  (1.803 m)     Head Circumference      Peak Flow      Pain Score 8     Pain Loc      Pain Edu?      Excl. in GC?     Constitutional: Alert and oriented. Well appearing and in no acute distress. Eyes: Conjunctivae are normal.  Head: Atraumatic. Nose: No congestion/rhinnorhea. Mouth/Throat: Mucous membranes are moist.  Positive for poor dentition.  The upper right molar is broken at the gumline and is blackened. Neck:  supple no lymphadenopathy noted Cardiovascular: Normal rate, regular rhythm. Heart sounds are normal Respiratory: Normal respiratory effort.  No retractions, lungs c t a  GU: deferred Musculoskeletal: FROM all extremities, warm and well perfused Neurologic:  Normal speech and language.  Skin:  Skin is warm, dry and intact. No rash noted. Psychiatric: Mood and affect are normal. Speech and behavior are normal.  ____________________________________________   LABS (all labs ordered are listed, but only abnormal results are displayed)  Labs Reviewed - No data  to display ____________________________________________   ____________________________________________  RADIOLOGY    ____________________________________________   PROCEDURES  Procedure(s) performed: No  Procedures    ____________________________________________   INITIAL IMPRESSION / ASSESSMENT AND PLAN / ED COURSE  Pertinent labs & imaging results that were available during my care of the patient were reviewed by me and considered in my medical decision making (see chart for details).   Patient is a 37 year old male presents emergency department complaining of right upper molar pain.  On  physical exam the right upper molar is decayed and broken at the gumline.  Discussed the findings with the patient.  He is to follow-up with a dentist.  He was given a prescription for Keflex 500 3 times daily for 7 days.  Ibuprofen 800 mg 3 times daily with food.  He is to apply an ice pack as needed to the right side of his face.  Return if worsening.  States he understands will comply.  He was discharged in stable condition     As part of my medical decision making, I reviewed the following data within the electronic MEDICAL RECORD NUMBER Nursing notes reviewed and incorporated, Notes from prior ED visits and [place] Controlled Substance Database  ____________________________________________   FINAL CLINICAL IMPRESSION(S) / ED DIAGNOSES  Final diagnoses:  Dental caries      NEW MEDICATIONS STARTED DURING THIS VISIT:  Discharge Medication List as of 08/14/2018  3:50 PM    START taking these medications   Details  cephALEXin (KEFLEX) 500 MG capsule Take 1 capsule (500 mg total) by mouth 3 (three) times daily., Starting Wed 08/14/2018, Normal    ibuprofen (ADVIL,MOTRIN) 800 MG tablet Take 1 tablet (800 mg total) by mouth every 8 (eight) hours as needed., Starting Wed 08/14/2018, Normal         Note:  This document was prepared using Dragon voice recognition software and may include unintentional dictation errors.    Faythe Ghee, PA-C 08/14/18 1706    Sharman Cheek, MD 08/18/18 [phone]

## 2018-08-14 NOTE — Discharge Instructions (Addendum)
OPTIONS FOR DENTAL FOLLOW UP CARE ° °[place] Department of Health and Human Services - Local Safety Net Dental Clinics °http://www.ncdhhs.gov/dph/oralhealth/services/safetynetclinics.htm °  °[facility] (336-562-3123) ° °[facility] [place] (919-933-9087) ° °[facility] ([phone] ext [phone]) ° °[facility] ([phone]) ° °[facility] ([phone]) °This clinic caters to the indigent population and is on a lottery system. °Location: °[facility], [address], [place] °Clinic Hours: °Wednesdays from 6pm - 9pm, patients seen by a lottery system. °For dates, call or go to [email or web address].edu/[facility]/patients/Dental-[facility] °Services: °Cleanings, fillings and simple extractions. °Payment Options: °DENTAL WORK IS FREE OF CHARGE. Bring proof of income or support. °Best way to get seen: °Arrive at 5:15 pm - this is a lottery, NOT first come/first serve, so arriving earlier will not increase your chances of being seen. °  °  °[facility] °[phone] °Select option 1 for emergencies °  °Location: °[facility], [address], [place] °Clinic Hours: °No walk-ins accepted - call the day before to schedule an appointment. °Check in times are 9:30 am and 1:30 pm. °Services: °Simple extractions, temporary fillings, pulpectomy/pulp debridement, uncomplicated abscess drainage. °Payment Options: °PAYMENT IS DUE AT THE TIME OF SERVICE.  Fee is usually $100-200, additional surgical procedures (e.g. abscess drainage) may be extra. °Cash, checks, Visa/MasterCard accepted.  Can file Medicaid if patient is covered for dental - patient should call case worker to check. °No discount for [facility] patients. °Best way to get seen: °MUST call the day before and get onto the schedule. Can usually be seen the next 1-2 days. No walk-ins accepted. °  °  °[facility] °[phone] °   °Location: °[facility], [address], [place] °Clinic Hours: °M, W, Th, F 8am or 1:30pm, Tues 9a or 1:30 - first come/first served. °Services: °Simple extractions, temporary fillings, uncomplicated abscess drainage.  You do not need to be an [place] resident. °Payment Options: °PAYMENT IS DUE AT THE TIME OF SERVICE. °Dental insurance, otherwise sliding scale - bring proof of income or support. °Depending on income and treatment needed, cost is usually $50-200. °Best way to get seen: °Arrive early as it is first come/first served. °  °  °[facility] °[phone] °  °Location: °[address] °Clinic Hours: °Mon-Thu 8a-5p °Services: °Most basic dental services including extractions and fillings. °Payment Options: °PAYMENT IS DUE AT THE TIME OF SERVICE. °Sliding scale, up to 50% off - bring proof if income or support. °Medicaid with dental option accepted. °Best way to get seen: °Call to schedule an appointment, can usually be seen within 2 weeks OR they will try to see walk-ins - show up at 8a or 2p (you may have to wait). °  °  °[facility] °919-245-2435 °[place] RESIDENTS ONLY °  °Location: °[facility], [address], [place], [place] [address] °Clinic Hours: By appointment only. °Monday - Thursday 8am-5pm, Friday 8am-12pm °Services: Cleanings, fillings, extractions. °Payment Options: °PAYMENT IS DUE AT THE TIME OF SERVICE. °Cash, Visa or MasterCard. Sliding scale - $30 minimum per service. °Best way to get seen: °Come in to office, complete packet and make an appointment - need proof of income °or support monies for each household member and proof of [place] residence. °Usually takes about a month to get in. °  °  °[facility] °[phone] °  °Location: °[address]   [place] °Clinic Hours: Walk-in Urgent Care Dental Services are offered Monday-Friday  mornings only. °The numbers of emergencies accepted daily is limited to the number of °providers available. °Maximum 15 - Mondays, Wednesdays & Thursdays °Maximum 10 - Tuesdays & Fridays °Services: °You do not need to be a [place] resident to be seen for a dental emergency. °Emergencies are defined as pain, swelling, abnormal bleeding, or dental trauma. Walkins will receive x-rays if needed. °NOTE: Dental cleaning is not an emergency. °Payment Options: °PAYMENT IS DUE AT THE TIME OF SERVICE. °Minimum co-pay is $40.00 for uninsured patients. °Minimum co-pay is $3.00 for Medicaid with dental coverage. °Dental Insurance is accepted and must be presented at time of visit. °Medicare does not cover dental. °Forms of payment: Cash, credit card, checks. °Best way to get seen: °If not previously registered with the clinic, walk-in dental registration begins at 7:15 am and is on a first come/first serve basis. °If previously registered with the clinic, call to make an appointment. °  °  °[facility] °[phone] °[place] RESIDENTS ONLY °  °Location: °[address], [place], [place] °Clinic Hours: °Mon-Thu 10a-2p °Services: Extractions only! °Payment Options: °FREE (donations accepted) - bring proof of income or support °Best way to get seen: °Call and schedule an appointment OR come at 8am on the 1st Monday of every month (except for holidays) when it is first come/first served. °  °  °[facility] °[phone] °  °Location: °[address], [place] °Clinic Hours: °Friday mornings °Services, Payment Options, Best way to get seen: °Call for info °

## 2018-12-03 ENCOUNTER — Emergency Department
Admission: EM | Admit: 2018-12-03 | Discharge: 2018-12-03 | Disposition: A | Discharge status: Home / Self Care | Payer: Self-pay | Attending: Emergency Medicine | Admitting: Emergency Medicine

## 2018-12-03 ENCOUNTER — Other Ambulatory Visit: Payer: Self-pay

## 2018-12-03 ENCOUNTER — Emergency Department: Payer: Self-pay

## 2018-12-03 ENCOUNTER — Encounter: Payer: Self-pay | Admitting: Emergency Medicine

## 2018-12-03 DIAGNOSIS — F1110 Opioid abuse, uncomplicated: Secondary | ICD-10-CM

## 2018-12-03 DIAGNOSIS — F1193 Opioid use, unspecified with withdrawal: Secondary | ICD-10-CM

## 2018-12-03 DIAGNOSIS — F1123 Opioid dependence with withdrawal: Secondary | ICD-10-CM | POA: Insufficient documentation

## 2018-12-03 DIAGNOSIS — Z79899 Other long term (current) drug therapy: Secondary | ICD-10-CM | POA: Insufficient documentation

## 2018-12-03 DIAGNOSIS — F17210 Nicotine dependence, cigarettes, uncomplicated: Secondary | ICD-10-CM | POA: Insufficient documentation

## 2018-12-03 LAB — URINALYSIS, COMPLETE (UACMP) WITH MICROSCOPIC
BACTERIA UA: NONE SEEN
Bilirubin Urine: NEGATIVE
Glucose, UA: NEGATIVE mg/dL
Hgb urine dipstick: NEGATIVE
KETONES UR: NEGATIVE mg/dL
Leukocytes, UA: NEGATIVE
Nitrite: NEGATIVE
PROTEIN: NEGATIVE mg/dL
Specific Gravity, Urine: 1.016 (ref 1.005–1.030)
pH: 5 (ref 5.0–8.0)

## 2018-12-03 LAB — BASIC METABOLIC PANEL
Anion gap: 8 (ref 5–15)
BUN: 13 mg/dL (ref 6–20)
CO2: 28 mmol/L (ref 22–32)
Calcium: 9.2 mg/dL (ref 8.9–10.3)
Chloride: 97 mmol/L — ABNORMAL LOW (ref 98–111)
Creatinine, Ser: 0.82 mg/dL (ref 0.61–1.24)
GFR calc Af Amer: >60 mL/min (ref >60)
GFR calc non Af Amer: >60 mL/min (ref >60)
Glucose, Bld: 105 mg/dL — ABNORMAL HIGH (ref 70–99)
POTASSIUM: 3.9 mmol/L (ref 3.5–5.1)
Sodium: 133 mmol/L — ABNORMAL LOW (ref 135–145)

## 2018-12-03 LAB — URINE DRUG SCREEN, QUALITATIVE (ARMC ONLY)
Amphetamines, Ur Screen: POSITIVE — AB
BENZODIAZEPINE, UR SCRN: NOT DETECTED
Barbiturates, Ur Screen: NOT DETECTED
Cannabinoid 50 Ng, Ur ~~LOC~~: NOT DETECTED
Cocaine Metabolite,Ur ~~LOC~~: POSITIVE — AB
MDMA (Ecstasy)Ur Screen: NOT DETECTED
METHADONE SCREEN, URINE: NOT DETECTED
Opiate, Ur Screen: POSITIVE — AB
PHENCYCLIDINE (PCP) UR S: NOT DETECTED
TRICYCLIC, UR SCREEN: NOT DETECTED

## 2018-12-03 LAB — CBC
HCT: 42.9 % (ref 39.0–52.0)
HEMOGLOBIN: 14.9 g/dL (ref 13.0–17.0)
MCH: 30.8 pg (ref 26.0–34.0)
MCHC: 34.7 g/dL (ref 30.0–36.0)
MCV: 88.6 fL (ref 80.0–100.0)
Platelets: 338 10*3/uL (ref 150–400)
RBC: 4.84 MIL/uL (ref 4.22–5.81)
RDW: 11.9 % (ref 11.5–15.5)
WBC: 9.1 10*3/uL (ref 4.0–10.5)
nRBC: 0 % (ref 0.0–0.2)

## 2018-12-03 MED ORDER — CYCLOBENZAPRINE HCL 10 MG PO TABS
10.0000 mg | ORAL_TABLET | Freq: Once | ORAL | Status: AC
  Administered 2018-12-03: 10 mg via ORAL
  Filled 2018-12-03: qty 1

## 2018-12-03 MED ORDER — HYDROXYZINE PAMOATE 25 MG PO CAPS: 25.0000 mg | ORAL_CAPSULE | Freq: Three times a day (TID) | ORAL | 0 refills | Status: DC | PRN

## 2018-12-03 MED ORDER — FAMOTIDINE 20 MG PO TABS
20.0000 mg | ORAL_TABLET | Freq: Once | ORAL | Status: AC
  Administered 2018-12-03: 20 mg via ORAL
  Filled 2018-12-03: qty 1

## 2018-12-03 MED ORDER — DICYCLOMINE HCL 20 MG PO TABS: 20.0000 mg | ORAL_TABLET | Freq: Three times a day (TID) | ORAL | 0 refills | Status: DC | PRN

## 2018-12-03 MED ORDER — HYDROXYZINE HCL 25 MG PO TABS
25.0000 mg | ORAL_TABLET | Freq: Once | ORAL | Status: AC
  Administered 2018-12-03: 25 mg via ORAL
  Filled 2018-12-03: qty 1

## 2018-12-03 MED ORDER — ONDANSETRON 4 MG PO TBDP: 4.0000 mg | ORAL_TABLET | Freq: Three times a day (TID) | ORAL | 0 refills | Status: AC | PRN

## 2018-12-03 MED ORDER — CYCLOBENZAPRINE HCL 5 MG PO TABS: 5.0000 mg | ORAL_TABLET | Freq: Three times a day (TID) | ORAL | 0 refills | Status: AC | PRN

## 2018-12-03 MED ORDER — ONDANSETRON 8 MG PO TBDP
8.0000 mg | ORAL_TABLET | Freq: Once | ORAL | Status: AC
  Administered 2018-12-03: 8 mg via ORAL
  Filled 2018-12-03: qty 1

## 2018-12-03 NOTE — ED Triage Notes (Signed)
Pt presents to ED via POV with c/o "extreme fatigue, dizziness, nausea, HA". Pt states hx of acid reflux that has worsened. Pt states all symptoms ongoing for several days.

## 2018-12-03 NOTE — ED Notes (Signed)
See triage note  Presents extreme fatigue and nausea    States sxs' started on Sunday  Has had chills no fever  Also states he has had some "memory loss"  Denies any trauma

## 2018-12-03 NOTE — ED Notes (Signed)
Nausea, weakness, memory loss, no distress noted at desk
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
## 2018-12-03 NOTE — Discharge Instructions (Addendum)
You are being treated for symptoms of substance abuse withdrawal. Take the prescription meds as directed. Call [facility], to arrange for consultation for drug treatment. Return to the Emergency Department as needed.

## 2019-06-17 ENCOUNTER — Other Ambulatory Visit: Payer: Self-pay

## 2019-06-17 DIAGNOSIS — J189 Pneumonia, unspecified organism: Secondary | ICD-10-CM | POA: Insufficient documentation

## 2019-06-17 DIAGNOSIS — R0602 Shortness of breath: Secondary | ICD-10-CM | POA: Diagnosis present

## 2019-06-17 DIAGNOSIS — Z20828 Contact with and (suspected) exposure to other viral communicable diseases: Secondary | ICD-10-CM | POA: Diagnosis not present

## 2019-06-17 DIAGNOSIS — F17200 Nicotine dependence, unspecified, uncomplicated: Secondary | ICD-10-CM | POA: Diagnosis not present

## 2019-06-18 ENCOUNTER — Other Ambulatory Visit: Payer: Self-pay

## 2019-06-18 ENCOUNTER — Emergency Department: Payer: HRSA Program

## 2019-06-18 ENCOUNTER — Emergency Department
Admission: EM | Admit: 2019-06-18 | Discharge: 2019-06-18 | Disposition: A | Discharge status: Home / Self Care | Payer: HRSA Program | Attending: Emergency Medicine | Admitting: Emergency Medicine

## 2019-06-18 DIAGNOSIS — J189 Pneumonia, unspecified organism: Secondary | ICD-10-CM

## 2019-06-18 DIAGNOSIS — R5383 Other fatigue: Secondary | ICD-10-CM

## 2019-06-18 DIAGNOSIS — Z20828 Contact with and (suspected) exposure to other viral communicable diseases: Secondary | ICD-10-CM

## 2019-06-18 DIAGNOSIS — Z20822 Contact with and (suspected) exposure to covid-19: Secondary | ICD-10-CM

## 2019-06-18 LAB — BASIC METABOLIC PANEL
Anion gap: 7 (ref 5–15)
BUN: 9 mg/dL (ref 6–20)
CO2: 26 mmol/L (ref 22–32)
Calcium: 8.6 mg/dL — ABNORMAL LOW (ref 8.9–10.3)
Chloride: 104 mmol/L (ref 98–111)
Creatinine, Ser: 0.73 mg/dL (ref 0.61–1.24)
GFR calc Af Amer: >60 mL/min (ref >60)
GFR calc non Af Amer: >60 mL/min (ref >60)
Glucose, Bld: 126 mg/dL — ABNORMAL HIGH (ref 70–99)
Potassium: 3.7 mmol/L (ref 3.5–5.1)
Sodium: 137 mmol/L (ref 135–145)

## 2019-06-18 LAB — CBC
HCT: 42.4 % (ref 39.0–52.0)
Hemoglobin: 14.1 g/dL (ref 13.0–17.0)
MCH: 30.8 pg (ref 26.0–34.0)
MCHC: 33.3 g/dL (ref 30.0–36.0)
MCV: 92.6 fL (ref 80.0–100.0)
Platelets: 333 10*3/uL (ref 150–400)
RBC: 4.58 MIL/uL (ref 4.22–5.81)
RDW: 11.9 % (ref 11.5–15.5)
WBC: 10.9 10*3/uL — ABNORMAL HIGH (ref 4.0–10.5)
nRBC: 0 % (ref 0.0–0.2)

## 2019-06-18 LAB — TROPONIN I (HIGH SENSITIVITY): Troponin I (High Sensitivity): <2 ng/L (ref <18)

## 2019-06-18 MED ORDER — AZITHROMYCIN 250 MG PO TABS: ORAL_TABLET | ORAL | 0 refills | Status: DC

## 2019-06-18 NOTE — ED Notes (Signed)
Pt alert and oriented X4, active, cooperative, pt in NAD. RR even and unlabored, color WNL.  Pt informed to return if any life threatening symptoms occur.  Discharge and followup instructions reviewed. Ambulates safely. 

## 2019-06-18 NOTE — ED Triage Notes (Signed)
Pt to the er for sob, chest tightness, cold chills, clammy skin.

## 2019-06-18 NOTE — ED Notes (Signed)
Pt reluctant to wake up for xray tech or registration. Giving short incoherent answers and states he just is tired. No shortness of breath noted. Resp even and unlabored. States he has felt bad since Friday

## 2019-06-18 NOTE — Discharge Instructions (Addendum)
Please read to the COVID-19 instructions.  We sent a swab and you can get the results in about 2 days in MyChart.  Take the full course of antibiotics as prescribed and try to drink plenty of fluids and get plenty of rest.  Return to the emergency department if you develop new or worsening symptoms that concern you.

## 2019-06-18 NOTE — ED Provider Notes (Signed)
[facility] Emergency Department Provider Note  ____________________________________________   First MD Initiated Contact with Patient 06/18/19 0601     (approximate)  I have reviewed the triage vital signs and the nursing notes.   HISTORY  Chief Complaint Shortness of Breath  Level 5 caveat:  history/ROS limited by the patient being minimally cooperative and is very vague historian.  HPI Kenneth Carey is a 38 y.o. male who only reports a history of prior kidney stones and presents for evaluation of  when he originally told triage was shortness of breath, cough, chest tightness, chills, and subjective fever.  However by the time I saw him he tells me he is just tired, wants to be left alone to sleep, and denies all of his symptoms.  Apparently there was a close COVID-19 contact within the family and both this patient came in for evaluation as well as his mother who is another 1 of my patients and is equally vague with her history.  Apparently the symptoms have been going on for couple of days and nothing in particular makes them better or worse.  The patient ambulated back from triage after a multi hour when he needs and then promptly went to sleep in the exam room.        Past Medical History:  Diagnosis Date   Renal disorder    kidney stones    There are no active problems to display for this patient.   History reviewed. No pertinent surgical history.  Prior to Admission medications   Medication Sig Start Date End Date Taking? Authorizing Provider  azithromycin (ZITHROMAX) 250 MG tablet Take 2 tablets PO on day 1, then take 1 tablet PO daily for 4 more days 06/18/19   Hinda Kehr, MD  dicyclomine (BENTYL) 20 MG tablet Take 1 tablet (20 mg total) by mouth 3 (three) times daily as needed for spasms. 12/03/18 12/03/19  Menshew, Dannielle Karvonen, PA-C  hydrOXYzine (VISTARIL) 25 MG capsule Take 1 capsule (25 mg total) by mouth 3 (three) times  daily as needed for itching. 12/03/18   Menshew, Dannielle Karvonen, PA-C  naloxone [facility]) nasal spray 4 mg/0.1 mL Apply to the nose if patient is not breathing or used too much heroin 11/08/16   Rudene Re, MD    Allergies Toradol [ketorolac tromethamine] and Penicillins  No family history on file.  Social History Social History   Tobacco Use   Smoking status: Current Every Day Smoker    Packs/day: 0.50   Smokeless tobacco: Never Used  Substance Use Topics   Alcohol use: Yes    Comment: occ   Drug use: Yes    Types: Cocaine, Marijuana, Methamphetamines    Review of Systems Level 5 caveat:  history/ROS limited by the patient being minimally cooperative and is very vague historian.  Constitutional: Subjective fever/chills Eyes: No visual changes. ENT: No sore throat. Cardiovascular: Denies chest pain. Respiratory: Positive shortness of breath and cough Gastrointestinal: No abdominal pain.  No nausea, no vomiting.  No diarrhea.  No constipation. Genitourinary: Negative for dysuria. Musculoskeletal: Possibly some generalized body aches.  Negative for neck pain.  Negative for back pain. Integumentary: Negative for rash. Neurological: Negative for headaches, focal weakness or numbness.   ____________________________________________   PHYSICAL EXAM:  VITAL SIGNS: ED Triage Vitals  Enc Vitals Group     BP 06/18/19 0530 116/69     Pulse Rate 06/18/19 0023 73     Resp 06/18/19 0023 20  Temp 06/18/19 0023 98.1 F (36.7 C)     Temp Source 06/18/19 0023 Oral     SpO2 06/18/19 0023 96 %     Weight 06/18/19 0025 77.1 kg (170 lb)     Height 06/18/19 0025 1.803 m (5\' 11" )     Head Circumference --      Peak Flow --      Pain Score 06/18/19 0024 8     Pain Loc --      Pain Edu? --      Excl. in GC? --     Constitutional: Somnolent, awakens with a significant amount of physical and verbal stimulation.  No acute distress.  Denies all symptoms.   Disheveled. Eyes: Conjunctivae are normal.  Head: Atraumatic. Nose: No congestion/rhinnorhea. Mouth/Throat: Mucous membranes are moist.  Poor dentition. Neck: No stridor.  No meningeal signs.   Cardiovascular: Normal rate, regular rhythm. Good peripheral circulation. Grossly normal heart sounds. Respiratory: Normal respiratory effort.  No retractions. No audible wheezing. Gastrointestinal: Soft and nontender. No distention.  Musculoskeletal: No lower extremity tenderness nor edema. No gross deformities of extremities. Neurologic:  Normal speech and language. No gross focal neurologic deficits are appreciated.  Skin:  Skin is warm, dry and intact. No rash noted. Psychiatric: Mood and affect are blunted and somewhat odd but without any concerning warning signs of emergent psychiatric admission.  ____________________________________________   LABS (all labs ordered are listed, but only abnormal results are displayed)  Labs Reviewed  BASIC METABOLIC PANEL - Abnormal; Notable for the following components:      Result Value   Glucose, Bld 126 (*)    Calcium 8.6 (*)    All other components within normal limits  CBC - Abnormal; Notable for the following components:   WBC 10.9 (*)    All other components within normal limits  NOVEL CORONAVIRUS, NAA (HOSPITAL ORDER, SEND-OUT TO REF LAB)  TROPONIN I (HIGH SENSITIVITY)   ____________________________________________  EKG  ED ECG REPORT I, Loleta Roseory Dyana Magner, the attending physician, personally viewed and interpreted this ECG.  Date: 06/18/2019 EKG Time: 00: 38 Rate: 91 Rhythm: normal sinus rhythm QRS Axis: normal Intervals: normal ST/T Wave abnormalities: normal Narrative Interpretation: no evidence of acute ischemia  ____________________________________________  RADIOLOGY I, Loleta Roseory Nicklous Aburto, personally viewed and evaluated these images (plain radiographs) as part of my medical decision making, as well as reviewing the written report by  the radiologist.  ED MD interpretation: Questionable atypical pneumonia at the bases versus atelectasis.  Official radiology report(s): Dg Chest Portable 1 View  Result Date: 06/18/2019 CLINICAL DATA:  Shortness of breath and cough EXAM: PORTABLE CHEST 1 VIEW COMPARISON:  12/03/2018 FINDINGS: Low volume chest with streaky opacity at both bases. Normal heart size and mediastinal contours. No edema, effusion, or pneumothorax. IMPRESSION: Low volume chest with atelectatic type opacities at the bases. Pneumonia, including from atypical pathogen, may also be present. Electronically Signed   By: Marnee SpringJonathon  Watts M.D.   On: 06/18/2019 06:00    ____________________________________________   PROCEDURES   Procedure(s) performed (including Critical Care):  Procedures   ____________________________________________   INITIAL IMPRESSION / MDM / ASSESSMENT AND PLAN / ED COURSE  As part of my medical decision making, I reviewed the following data within the electronic MEDICAL RECORD NUMBER Nursing notes reviewed and incorporated, Labs reviewed , EKG interpreted , Old chart reviewed, Radiograph reviewed , Notes from prior ED visits and [place] Controlled Substance Database   Differential diagnosis includes, but is not limited to, viral infection  including COVID-19, electrolyte or metabolic abnormality, sepsis, intoxication on alcohol or drug use.  I observed the patient ambulating back to the exam room from the waiting room after waiting for multiple hours for bed.  Now he just wants to sleep.  He is in no distress and has normal vital signs and is afebrile.  He is in no distress and has normal lab work.  There is a question of atelectasis versus atypical pneumonia on his chest x-ray so I will give him a Z-Pak but he does not require further evaluation or treatment.  Given the questionable close COVID-19 contact I am sending a send out nasal swab but there is no indication for the rapid swab.  I explained to him my  plan and he nods in agreement and then pulled the blanket over his head and went back to sleep.          ____________________________________________  FINAL CLINICAL IMPRESSION(S) / ED DIAGNOSES  Final diagnoses:  Atypical pneumonia  Fatigue, unspecified type  Close Exposure to Covid-19 Virus     MEDICATIONS GIVEN DURING THIS VISIT:  Medications - No data to display   ED Discharge Orders         Ordered    azithromycin (ZITHROMAX) 250 MG tablet     06/18/19 [id number]          *Please note:  Kenneth Carey was evaluated in Emergency Department on 06/18/2019 for the symptoms described in the history of present illness. He was evaluated in the context of the global COVID-19 pandemic, which necessitated consideration that the patient might be at risk for infection with the SARS-CoV-2 virus that causes COVID-19. Institutional protocols and algorithms that pertain to the evaluation of patients at risk for COVID-19 are in a state of rapid change based on information released by regulatory bodies including the CDC and federal and state organizations. These policies and algorithms were followed during the patient's care in the ED.  Some ED evaluations and interventions may be delayed as a result of limited staffing during the pandemic.*  Note:  This document was prepared using Dragon voice recognition software and may include unintentional dictation errors.   Loleta RoseForbach, Tameaka Eichhorn, MD 06/18/19 [phone]

## 2019-06-19 LAB — NOVEL CORONAVIRUS, NAA (HOSP ORDER, SEND-OUT TO REF LAB; TAT 18-24 HRS): SARS-CoV-2, NAA: NOT DETECTED

## 2019-06-24 ENCOUNTER — Telehealth: Payer: Self-pay | Admitting: Emergency Medicine

## 2019-06-24 NOTE — Telephone Encounter (Signed)
Called pateint to inform of negative covid 19 result.  Left message.

## 2020-05-03 ENCOUNTER — Other Ambulatory Visit: Payer: Self-pay

## 2020-05-03 ENCOUNTER — Encounter: Payer: Self-pay | Admitting: Emergency Medicine

## 2020-05-03 ENCOUNTER — Emergency Department
Admission: EM | Admit: 2020-05-03 | Discharge: 2020-05-03 | Disposition: A | Discharge status: Home / Self Care | Payer: Self-pay | Attending: Emergency Medicine | Admitting: Emergency Medicine

## 2020-05-03 DIAGNOSIS — F10929 Alcohol use, unspecified with intoxication, unspecified: Secondary | ICD-10-CM | POA: Insufficient documentation

## 2020-05-03 DIAGNOSIS — F10920 Alcohol use, unspecified with intoxication, uncomplicated: Secondary | ICD-10-CM

## 2020-05-03 DIAGNOSIS — T50901A Poisoning by unspecified drugs, medicaments and biological substances, accidental (unintentional), initial encounter: Secondary | ICD-10-CM | POA: Insufficient documentation

## 2020-05-03 LAB — CBC WITH DIFFERENTIAL/PLATELET
Abs Immature Granulocytes: 0.19 10*3/uL — ABNORMAL HIGH (ref 0.00–0.07)
Basophils Absolute: 0.1 10*3/uL (ref 0.0–0.1)
Basophils Relative: 1 %
Eosinophils Absolute: 0.4 10*3/uL (ref 0.0–0.5)
Eosinophils Relative: 4 %
HCT: 48.9 % (ref 39.0–52.0)
Hemoglobin: 16.2 g/dL (ref 13.0–17.0)
Immature Granulocytes: 2 %
Lymphocytes Relative: 42 %
Lymphs Abs: 4.8 10*3/uL — ABNORMAL HIGH (ref 0.7–4.0)
MCH: 33.5 pg (ref 26.0–34.0)
MCHC: 33.1 g/dL (ref 30.0–36.0)
MCV: 101.2 fL — ABNORMAL HIGH (ref 80.0–100.0)
Monocytes Absolute: 0.8 10*3/uL (ref 0.1–1.0)
Monocytes Relative: 7 %
Neutro Abs: 5.2 10*3/uL (ref 1.7–7.7)
Neutrophils Relative %: 44 %
Platelets: 341 10*3/uL (ref 150–400)
RBC: 4.83 MIL/uL (ref 4.22–5.81)
RDW: 13 % (ref 11.5–15.5)
Smear Review: NORMAL
WBC: 11.4 10*3/uL — ABNORMAL HIGH (ref 4.0–10.5)
nRBC: 0 % (ref 0.0–0.2)

## 2020-05-03 LAB — COMPREHENSIVE METABOLIC PANEL
ALT: 42 U/L (ref 0–44)
AST: 46 U/L — ABNORMAL HIGH (ref 15–41)
Albumin: 4 g/dL (ref 3.5–5.0)
Alkaline Phosphatase: 94 U/L (ref 38–126)
Anion gap: 11 (ref 5–15)
BUN: 8 mg/dL (ref 6–20)
CO2: 26 mmol/L (ref 22–32)
Calcium: 8.3 mg/dL — ABNORMAL LOW (ref 8.9–10.3)
Chloride: 103 mmol/L (ref 98–111)
Creatinine, Ser: 1.37 mg/dL — ABNORMAL HIGH (ref 0.61–1.24)
GFR calc Af Amer: >60 mL/min (ref >60)
GFR calc non Af Amer: >60 mL/min (ref >60)
Glucose, Bld: 186 mg/dL — ABNORMAL HIGH (ref 70–99)
Potassium: 3.7 mmol/L (ref 3.5–5.1)
Sodium: 140 mmol/L (ref 135–145)
Total Bilirubin: 0.5 mg/dL (ref 0.3–1.2)
Total Protein: 7.7 g/dL (ref 6.5–8.1)

## 2020-05-03 LAB — ETHANOL: Alcohol, Ethyl (B): 143 mg/dL — ABNORMAL HIGH (ref <10)

## 2020-05-03 MED ORDER — NALOXONE HCL 2 MG/2ML IJ SOSY: 0.4000 mg | PREFILLED_SYRINGE | Freq: Once | INTRAMUSCULAR | Status: DC

## 2020-05-03 MED ORDER — NALOXONE HCL 2 MG/2ML IJ SOSY
2.0000 mg | PREFILLED_SYRINGE | Freq: Once | INTRAMUSCULAR | Status: AC
  Administered 2020-05-03: 2 mg via INTRAVENOUS

## 2020-05-03 MED ORDER — SODIUM CHLORIDE 0.9 % IV SOLN: Freq: Once | INTRAVENOUS | Status: AC

## 2020-05-03 NOTE — ED Provider Notes (Signed)
ER Provider Note       Time seen: 8:08 PM    I have reviewed the vital signs and the nursing notes.  HISTORY   Chief Complaint unresponsive    HPI Kenneth Carey is a 39 y.o. male with no known past medical history who presents today for unresponsiveness.  Patient was found in a parking lot by bystanders who began CPR.  When EMS arrived he had strong pulses and snoring respirations.  He was given nasal Narcan without any improvement.  Blood sugar was normal.  He was tachycardic.  No further information is available.  No past medical history on file.  Allergies Patient has no allergy information on record.   Review of Systems Review of systems is unknown at this time  All systems negative/normal/unremarkable except as stated in the HPI  ____________________________________________   PHYSICAL EXAM:  VITAL SIGNS: There were no vitals filed for this visit.  Constitutional: Unresponsive Eyes: Disconjugate gaze, pupils equal but sluggish ENT      Head: Normocephalic and atraumatic.      Nose: No congestion/rhinnorhea.      Mouth/Throat: Mucous membranes are moist.      Neck: No stridor. Cardiovascular: Rapid rate, regular rhythm. No murmurs, rubs, or gallops. Respiratory: Normal respiratory effort without tachypnea nor retractions. Breath sounds are clear and equal bilaterally. No wheezes/rales/rhonchi. Gastrointestinal: Soft and nontender. Normal bowel sounds Musculoskeletal: No lower extremity tenderness nor edema. Neurologic: Patient unresponsive to painful stimuli Skin:  Skin is warm, dry and intact. No rash noted. Psychiatric: Unresponsive on arrival ____________________________________________  EKG: Interpreted by me.  Sinus tachycardia with a rate of 124 bpm, normal PR interval, normal QRS, normal QT  ____________________________________________   LABS (pertinent positives/negatives)  Labs Reviewed  CBC WITH DIFFERENTIAL/PLATELET - Abnormal; Notable  for the following components:      Result Value   WBC 11.4 (*)    MCV 101.2 (*)    Lymphs Abs 4.8 (*)    Abs Immature Granulocytes 0.19 (*)    All other components within normal limits  COMPREHENSIVE METABOLIC PANEL - Abnormal; Notable for the following components:   Glucose, Bld 186 (*)    Creatinine, Ser 1.37 (*)    Calcium 8.3 (*)    AST 46 (*)    All other components within normal limits  ETHANOL - Abnormal; Notable for the following components:   Alcohol, Ethyl (B) 143 (*)    All other components within normal limits  URINALYSIS, COMPLETE (UACMP) WITH MICROSCOPIC  URINE DRUG SCREEN, QUALITATIVE ([facility] ONLY)  CBG MONITORING, ED   CRITICAL CARE Performed by: Ulice Dash   Total critical care time: 30 minutes  Critical care time was exclusive of separately billable procedures and treating other patients.  Critical care was necessary to treat or prevent imminent or life-threatening deterioration.  Critical care was time spent personally by me on the following activities: development of treatment plan with patient and/or surrogate as well as nursing, discussions with consultants, evaluation of patient's response to treatment, examination of patient, obtaining history from patient or surrogate, ordering and performing treatments and interventions, ordering and review of laboratory studies, ordering and review of radiographic studies, pulse oximetry and re-evaluation of patient's condition.   DIFFERENTIAL DIAGNOSIS  Overdose, arrhythmia, MI, cardiopulmonary arrest, intoxication  ASSESSMENT AND PLAN  Accidental overdose, alcohol intoxication   Plan: The patient had presented for what appeared to be accidental overdose.  On arrival patient was given 2 mg of IV Narcan via right  sided external jugular vein IV placed by me.  Sonorous respirations improved and he regained consciousness.  He was then given IV fluids and observed in the ER.  Patient's labs did indicate alcohol  intoxication but no other acute process.  He was given fluids and observed without any sequela.  He declines detox referral.  Lenise Arena MD    Note: This note was generated in part or whole with voice recognition software. Voice recognition is usually quite accurate but there are transcription errors that can and very often do occur. I apologize for any typographical errors that were not detected and corrected.     Kenneth Newport, MD 05/03/20 2123

## 2020-05-03 NOTE — ED Notes (Signed)
Pt refusing to keep vital sign monitoring device on. MD Williams aware.

## 2020-05-03 NOTE — ED Notes (Signed)
2mg  narcan given IV verbal order MD 

## 2020-05-03 NOTE — ED Triage Notes (Signed)
Pt presents via [facility]ms with c/o unresponsiveness. Pt was found in parking lot and bystanders began cpr. When ems arrived to scene, pt had pulses and snoring respirations. Pt bagged in route. cbg 235 for ems. hr 115, 2 mg narcan given by ems

## 2020-05-03 NOTE — ED Notes (Signed)
Pt becoming agitated and requesting to go outside and smoke cigarette. Doctor Mayford Knife at bedside. IV's removed by Elijah Birk, RN. Pt requesting to leave at this time. Pt alert and oriented and appears to be in NAD at time of departure. Pt refusing to sign for discharge papers or have vitals taken prior to departure.

## 2020-05-03 NOTE — ED Notes (Signed)
Pt now awake and with eyes open. Pt asking "where am I" at this time.

## 2020-06-13 ENCOUNTER — Emergency Department
Admission: EM | Admit: 2020-06-13 | Discharge: 2020-06-13 | Disposition: A | Discharge status: Home / Self Care | Payer: Self-pay | Attending: Emergency Medicine | Admitting: Emergency Medicine

## 2020-06-13 DIAGNOSIS — F17200 Nicotine dependence, unspecified, uncomplicated: Secondary | ICD-10-CM | POA: Insufficient documentation

## 2020-06-13 DIAGNOSIS — T401X1A Poisoning by heroin, accidental (unintentional), initial encounter: Secondary | ICD-10-CM | POA: Insufficient documentation

## 2020-06-13 LAB — MAGNESIUM: Magnesium: 2.3 mg/dL (ref 1.7–2.4)

## 2020-06-13 LAB — CK: Total CK: 147 U/L (ref 49–397)

## 2020-06-13 MED ORDER — ONDANSETRON 4 MG PO TBDP
4.0000 mg | ORAL_TABLET | Freq: Four times a day (QID) | ORAL | Status: DC | PRN
  Administered 2020-06-13: 4 mg via ORAL
  Filled 2020-06-13: qty 1

## 2020-06-13 MED ORDER — METHOCARBAMOL 500 MG PO TABS
500.0000 mg | ORAL_TABLET | Freq: Three times a day (TID) | ORAL | Status: DC | PRN
  Filled 2020-06-13: qty 1

## 2020-06-13 MED ORDER — LOPERAMIDE HCL 2 MG PO CAPS
2.0000 mg | ORAL_CAPSULE | ORAL | Status: DC | PRN
  Administered 2020-06-13: 2 mg via ORAL
  Filled 2020-06-13: qty 1

## 2020-06-13 MED ORDER — POTASSIUM CHLORIDE 20 MEQ PO PACK
40.0000 meq | PACK | Freq: Once | ORAL | Status: AC
  Administered 2020-06-13: 40 meq via ORAL
  Filled 2020-06-13: qty 2

## 2020-06-13 MED ORDER — DICYCLOMINE HCL 20 MG PO TABS
20.0000 mg | ORAL_TABLET | Freq: Four times a day (QID) | ORAL | Status: DC | PRN
  Administered 2020-06-13: 20 mg via ORAL
  Filled 2020-06-13: qty 1

## 2020-06-13 MED ORDER — NALOXONE HCL 4 MG/0.1ML NA LIQD
1.0000 | Freq: Once | NASAL | Status: DC
  Filled 2020-06-13: qty 4

## 2020-06-13 MED ORDER — HYDROXYZINE HCL 25 MG PO TABS
25.0000 mg | ORAL_TABLET | Freq: Four times a day (QID) | ORAL | Status: DC | PRN
  Administered 2020-06-13: 25 mg via ORAL
  Filled 2020-06-13: qty 1

## 2020-06-13 MED ORDER — CLONIDINE HCL 0.1 MG/24HR TD PTWK
0.1000 mg | MEDICATED_PATCH | TRANSDERMAL | Status: DC
  Administered 2020-06-13: 0.1 mg via TRANSDERMAL
  Filled 2020-06-13: qty 1

## 2020-06-13 NOTE — ED Provider Notes (Addendum)
[facility] Emergency Department Provider Note   ____________________________________________   First MD Initiated Contact with Patient 06/13/20 1153     (approximate)  I have reviewed the triage vital signs and the nursing notes.   HISTORY  Chief Complaint Drug Overdose  Chief complaint is overdose  HPI Kenneth Carey is a 39 y.o. male patient brought in by EMS after being found down.  EMS reports they gave him 2 mg of Narcan in each nostril.  Patient is now complaining of severe pain in both of his legs.  He is restless and irritable.  Looks like he probably is withdrawing.  Patient is moving both legs equally and well.  I have put in the clonidine withdrawal protocol along for him     Past Medical History:  Diagnosis Date  . Renal disorder    kidney stones    There are no problems to display for this patient.   No past surgical history on file.  Prior to Admission medications   Medication Sig Start Date End Date Taking? Authorizing Provider  naloxone [facility]) nasal spray 4 mg/0.1 mL Apply to the nose if patient is not breathing or used too much heroin 11/08/16   Nita Sickle, MD    Allergies Toradol [ketorolac tromethamine] and Penicillins  No family history on file.  Social History Social History   Tobacco Use  . Smoking status: Current Every Day Smoker    Packs/day: 0.50  . Smokeless tobacco: Never Used  Substance Use Topics  . Alcohol use: Yes    Comment: occ  . Drug use: Yes    Types: Cocaine, Marijuana, Methamphetamines    Review of Systems  Constitutional: No fever/chills Eyes: No visual changes. ENT: No sore throat. Cardiovascular: Denies chest pain. Respiratory: Denies shortness of breath. Gastrointestinal: No abdominal pain.  No nausea, no vomiting.  No diarrhea.  No constipation. Genitourinary: Negative for dysuria. Musculoskeletal: Negative for back pain. Skin: Negative for rash. Neurological:  Negative for headaches, focal weakness   ____________________________________________   PHYSICAL EXAM:  VITAL SIGNS: ED Triage Vitals  Enc Vitals Group     BP      Pulse      Resp      Temp      Temp src      SpO2      Weight      Height      Head Circumference      Peak Flow      Pain Score      Pain Loc      Pain Edu?      Excl. in GC?     Constitutional: Alert and oriented.  Complaining of severe pain in both legs. Eyes: Conjunctivae are normal. PER. EOMI. Head: Atraumatic. Nose: No congestion/rhinnorhea. Mouth/Throat: Mucous membranes are moist.  Oropharynx non-erythematous. Neck: No stridor.  Cardiovascular: Normal rate, regular rhythm. Grossly normal heart sounds.  Good peripheral circulation.  Normal pulses and capillary refill in his feet Respiratory: Normal respiratory effort.  No retractions. Lungs CTAB. Gastrointestinal: Soft and nontender. No distention. No abdominal bruits.  Musculoskeletal: No lower extremity tenderness nor edema.  No cramping palpated in the calfs or thighs.  Normal distal pulses and capillary refill.  Feet are warm. Neurologic:  Normal speech and language. No gross focal neurologic deficits are appreciated.  Skin:  Skin is warm, dry and intact. No rash noted.   ____________________________________________   LABS (all labs ordered are listed, but only abnormal results  are displayed)  Labs Reviewed  CK  MAGNESIUM   ____________________________________________  EKG   ____________________________________________  RADIOLOGY  ED MD interpretation:    Official radiology report(s): No results found.  ____________________________________________   PROCEDURES  Procedure(s) performed (including Critical Care):  Procedures   ____________________________________________   INITIAL IMPRESSION / ASSESSMENT AND PLAN / ED COURSE   ----------------------------------------- 2:34 PM on  06/13/2020 -----------------------------------------  Patient is now sleeping.  He is not complaining of pain in his legs anymore.  His wife is with him.  She reports he has been complaining of some leg pains when he stays still for too long for some time now.  His potassium is slightly low we will check his magnesium as well.  We will give him some potassium and see if that helps.  Otherwise we will have him follow-up with his doctor for that.             ____________________________________________   FINAL CLINICAL IMPRESSION(S) / ED DIAGNOSES  Final diagnoses:  Accidental overdose of heroin, initial encounter [facility])     ED Discharge Orders    None       Note:  This document was prepared using Dragon voice recognition software and may include unintentional dictation errors.    Arnaldo Natal, MD 06/13/20 1606    Arnaldo Natal, MD 06/13/20 [phone]

## 2020-06-13 NOTE — Discharge Instructions (Addendum)
Be very careful with the heroin.  You almost died today according to EMS.  Please follow-up with your regular doctor.  If you have insurance she can follow-up with [facility].  If you do not you can follow-up with the [facility] or the [facility] over the [facility] or the [facility] or [facility] charity care or [employer] care.  I can all work on addressing your leg pain.  The test that we had done here today all look okay.  Please keep Narcan nearby in the future with any recreational heroin or opioid use.  This can allow another person to help you if you overdose.

## 2020-06-13 NOTE — ED Provider Notes (Signed)
Procedures     ----------------------------------------- 7:08 PM on 06/13/2020 -----------------------------------------  Patient awake alert, asymptomatic.  Request something to drink.  Vital signs are normal.  Oxygenation is 97% on room air.  He clearly recalls that he used heroin earlier today, denies any long-acting opioids.  Stable for discharge home.  Discussed the possibility of substance abuse treatment with him.  He states that his employer is already getting him set up with a rehab program and he is not interested in facilitating any treatment here from the ER.  He does not have Narcan at home, I will order some Narcan to be dispensed with him, discussed keeping this nearby with any recreational opioid use to allow others to help.  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

## 2020-06-13 NOTE — ED Triage Notes (Signed)
PT biba via stretcher for c/o drug overdose.  Found down with drug paraphernalia at the scene.  Given 4 doses of narcan IN.  Pt presents GCS of 15, restless and complaining of bilateral lower leg pain. PEARL at this time, airway patent, rr even/unlabored, clear bilaterally, abd soft/flat/nontender, no masses noted.  Pelvis stable, no trauma or abdormalities noted to extremeties, +PMSC x4, skin warm/dry.  VSS pt placed on bedside cardiac monitor, MD and team at bedside for assessment.

## 2020-06-13 NOTE — ED Notes (Signed)
Pt alert and awake at this time, meal tray and coke and water to drink given.

## 2020-06-13 NOTE — ED Notes (Addendum)
Error, wrote on wrong patient

## 2021-05-07 ENCOUNTER — Other Ambulatory Visit: Payer: Self-pay

## 2021-05-07 ENCOUNTER — Emergency Department
Admission: EM | Admit: 2021-05-07 | Discharge: 2021-05-07 | Disposition: A | Discharge status: Home / Self Care | Payer: Self-pay | Attending: Emergency Medicine | Admitting: Emergency Medicine

## 2021-05-07 ENCOUNTER — Emergency Department: Payer: Self-pay

## 2021-05-07 ENCOUNTER — Encounter: Payer: Self-pay | Admitting: Emergency Medicine

## 2021-05-07 DIAGNOSIS — F17210 Nicotine dependence, cigarettes, uncomplicated: Secondary | ICD-10-CM | POA: Insufficient documentation

## 2021-05-07 DIAGNOSIS — M5416 Radiculopathy, lumbar region: Secondary | ICD-10-CM | POA: Insufficient documentation

## 2021-05-07 DIAGNOSIS — M5410 Radiculopathy, site unspecified: Secondary | ICD-10-CM

## 2021-05-07 DIAGNOSIS — Z5321 Procedure and treatment not carried out due to patient leaving prior to being seen by health care provider: Secondary | ICD-10-CM | POA: Insufficient documentation

## 2021-05-07 MED ORDER — HYDROMORPHONE HCL 1 MG/ML IJ SOLN
1.0000 mg | Freq: Once | INTRAMUSCULAR | Status: AC
  Administered 2021-05-07: 1 mg via INTRAMUSCULAR
  Filled 2021-05-07: qty 1

## 2021-05-07 MED ORDER — ORPHENADRINE CITRATE 30 MG/ML IJ SOLN
60.0000 mg | Freq: Two times a day (BID) | INTRAMUSCULAR | Status: DC
  Administered 2021-05-07: 60 mg via INTRAMUSCULAR
  Filled 2021-05-07: qty 2

## 2021-05-07 MED ORDER — OXYCODONE-ACETAMINOPHEN 7.5-325 MG PO TABS: 1.0000 | ORAL_TABLET | Freq: Four times a day (QID) | ORAL | 0 refills | Status: DC | PRN

## 2021-05-07 MED ORDER — ORPHENADRINE CITRATE ER 100 MG PO TB12: 100.0000 mg | ORAL_TABLET | Freq: Two times a day (BID) | ORAL | 0 refills | Status: DC

## 2021-05-07 NOTE — ED Triage Notes (Signed)
Pt reports lower back pain that radiates down his left leg for the past 2 days. Denies injuries

## 2021-05-07 NOTE — ED Notes (Signed)
Patient left without being discharged. First nurse saw the patient leave. Patient stated to this writer that his fiance was picking him up before the Dilaudid was given.

## 2021-05-07 NOTE — Discharge Instructions (Addendum)
Patient eloped after receiving Dilaudid and Norflex.

## 2021-05-07 NOTE — ED Provider Notes (Signed)
[facility] Emergency Department Provider Note   ____________________________________________   Event Date/Time   First MD Initiated Contact with Patient 05/07/21 1304     (approximate)  I have reviewed the triage vital signs and the nursing notes.   HISTORY  Chief Complaint Back Pain and Leg Pain    HPI Kenneth Carey is a 40 y.o. male patient presents with low back pain with radiculopathy to the left lower extremity for 2 days.  Patient denies provocative incident for complaint.  Patient denies bladder or bowel dysfunction.  Patient rates the pain as a 6/10.  No palliative measure for complaint.         Past Medical History:  Diagnosis Date  . Renal disorder    kidney stones    There are no problems to display for this patient.   History reviewed. No pertinent surgical history.  Prior to Admission medications   Medication Sig Start Date End Date Taking? Authorizing Provider  naloxone [facility]) nasal spray 4 mg/0.1 mL Apply to the nose if patient is not breathing or used too much heroin 11/08/16   Nita Sickle, MD    Allergies Toradol [ketorolac tromethamine] and Penicillins  No family history on file.  Social History Social History   Tobacco Use  . Smoking status: Current Every Day Smoker    Packs/day: 0.50  . Smokeless tobacco: Never Used  Substance Use Topics  . Alcohol use: Yes    Comment: occ  . Drug use: Yes    Types: Cocaine, Marijuana, Methamphetamines    Review of Systems Constitutional: No fever/chills Eyes: No visual changes. ENT: No sore throat. Cardiovascular: Denies chest pain. Respiratory: Denies shortness of breath. Gastrointestinal: No abdominal pain.  No nausea, no vomiting.  No diarrhea.  No constipation. Genitourinary: Negative for dysuria. Musculoskeletal: Negative for back pain. Skin: Negative for rash. Neurological: Negative for headaches, focal weakness or  numbness. Allergic/Immunilogical: Toradol and penicillin. ____________________________________________   PHYSICAL EXAM:  VITAL SIGNS: ED Triage Vitals  Enc Vitals Group     BP 05/07/21 1229 (!) 147/82     Pulse Rate 05/07/21 1229 (!) 116     Resp 05/07/21 1229 20     Temp 05/07/21 1229 98.7 F (37.1 C)     Temp Source 05/07/21 1229 Oral     SpO2 05/07/21 1229 96 %     Weight 05/07/21 1225 230 lb (104.3 kg)     Height 05/07/21 1225 5\' 11"  (1.803 m)     Head Circumference --      Peak Flow --      Pain Score 05/07/21 1225 6     Pain Loc --      Pain Edu? --      Excl. in GC? --    Constitutional: Alert and oriented. Well appearing and in no acute distress. Cardiovascular: Normal rate, regular rhythm. Grossly normal heart sounds.  Good peripheral circulation. Respiratory: Normal respiratory effort.  No retractions. Lungs CTAB. Gastrointestinal: Soft and nontender. No distention. No abdominal bruits. No CVA tenderness. Genitourinary: Deferred Musculoskeletal: No lower extremity tenderness nor edema.  No joint effusions. Neurologic:  Normal speech and language. No gross focal neurologic deficits are appreciated. No gait instability. Skin:  Skin is warm, dry and intact. No rash noted. Psychiatric: Mood and affect are normal. Speech and behavior are normal.  ____________________________________________   LABS (all labs ordered are listed, but only abnormal results are displayed)  Labs Reviewed - No data to display ____________________________________________  EKG   ____________________________________________  RADIOLOGY Margarite Gouge, personally viewed and evaluated these images (plain radiographs) as part of my medical decision making, as well as reviewing the written report by the radiologist.  ED MD interpretation:    Official radiology report(s): DG Lumbar Spine 2-3 Views  Result Date: 05/07/2021 CLINICAL DATA:  Lower back pain radiating to LEFT leg, no known  injury EXAM: LUMBAR SPINE - 2-3 VIEW COMPARISON:  None Correlation: Chest radiograph 12/03/2018 FINDINGS: Twelve pairs of ribs on most recent chest radiograph. Six non-rib-bearing lumbar type segments, labeled L1 through lumbarized S1. Disc space narrowing L5-S1. Vertebral body heights maintained. No fracture, subluxation, bone destruction, or spondylolysis. SI joints preserved. Joint space narrowing with bone-on-bone appearance at superior margin of LEFT hip joint. IMPRESSION: Mild degenerative disc disease changes at L5-S1. No acute osseous abnormalities. Degenerative changes LEFT hip joint. Electronically Signed   By: Ulyses Southward M.D.   On: 05/07/2021 13:47    ____________________________________________   PROCEDURES  Procedure(s) performed (including Critical Care):  Procedures   ____________________________________________   INITIAL IMPRESSION / ASSESSMENT AND PLAN / ED COURSE  As part of my medical decision making, I reviewed the following data within the electronic MEDICAL RECORD NUMBER         Discussed x-ray findings with patient.  Patient eloped after receiving Norflex and Dilaudid.      ____________________________________________   FINAL CLINICAL IMPRESSION(S) / ED DIAGNOSES  Final diagnoses:  Radicular low back pain     ED Discharge Orders    None       Note:  This document was prepared using Dragon voice recognition software and may include unintentional dictation errors.    Joni Reining, PA-C 05/07/21 1553    Dionne Bucy, MD 05/08/21 [phone]

## 2021-05-07 NOTE — ED Notes (Signed)
Ron, PA notified of pt request for pain medication. Awaiting test results, patient notified.

## 2021-05-08 ENCOUNTER — Other Ambulatory Visit: Payer: Self-pay

## 2021-05-08 ENCOUNTER — Emergency Department
Admission: EM | Admit: 2021-05-08 | Discharge: 2021-05-09 | Disposition: A | Discharge status: Home / Self Care | Payer: Self-pay | Attending: Emergency Medicine | Admitting: Emergency Medicine

## 2021-05-08 DIAGNOSIS — M5416 Radiculopathy, lumbar region: Secondary | ICD-10-CM | POA: Insufficient documentation

## 2021-05-08 DIAGNOSIS — F17200 Nicotine dependence, unspecified, uncomplicated: Secondary | ICD-10-CM | POA: Insufficient documentation

## 2021-05-08 LAB — URINALYSIS, COMPLETE (UACMP) WITH MICROSCOPIC
Bacteria, UA: NONE SEEN
Bilirubin Urine: NEGATIVE
Glucose, UA: NEGATIVE mg/dL
Hgb urine dipstick: NEGATIVE
Ketones, ur: NEGATIVE mg/dL
Nitrite: NEGATIVE
Protein, ur: NEGATIVE mg/dL
Specific Gravity, Urine: 1.025 (ref 1.005–1.030)
Squamous Epithelial / HPF: NONE SEEN (ref 0–5)
pH: 5 (ref 5.0–8.0)

## 2021-05-08 LAB — CBC
HCT: 45.7 % (ref 39.0–52.0)
Hemoglobin: 15.5 g/dL (ref 13.0–17.0)
MCH: 33.2 pg (ref 26.0–34.0)
MCHC: 33.9 g/dL (ref 30.0–36.0)
MCV: 97.9 fL (ref 80.0–100.0)
Platelets: 253 10*3/uL (ref 150–400)
RBC: 4.67 MIL/uL (ref 4.22–5.81)
RDW: 12.9 % (ref 11.5–15.5)
WBC: 8.9 10*3/uL (ref 4.0–10.5)
nRBC: 0 % (ref 0.0–0.2)

## 2021-05-08 LAB — BASIC METABOLIC PANEL
Anion gap: 13 (ref 5–15)
BUN: 8 mg/dL (ref 6–20)
CO2: 22 mmol/L (ref 22–32)
Calcium: 9.3 mg/dL (ref 8.9–10.3)
Chloride: 102 mmol/L (ref 98–111)
Creatinine, Ser: 0.69 mg/dL (ref 0.61–1.24)
GFR, Estimated: >60 mL/min (ref >60)
Glucose, Bld: 121 mg/dL — ABNORMAL HIGH (ref 70–99)
Potassium: 3.4 mmol/L — ABNORMAL LOW (ref 3.5–5.1)
Sodium: 137 mmol/L (ref 135–145)

## 2021-05-08 MED ORDER — IBUPROFEN 800 MG PO TABS
800.0000 mg | ORAL_TABLET | Freq: Once | ORAL | Status: AC
  Administered 2021-05-08: 800 mg via ORAL
  Filled 2021-05-08: qty 1

## 2021-05-08 MED ORDER — METHOCARBAMOL 500 MG PO TABS: 500.0000 mg | ORAL_TABLET | Freq: Three times a day (TID) | ORAL | 0 refills | Status: DC | PRN

## 2021-05-08 MED ORDER — METHYLPREDNISOLONE 4 MG PO TBPK: ORAL_TABLET | ORAL | 0 refills | Status: DC

## 2021-05-08 MED ORDER — IBUPROFEN 800 MG PO TABS: 800.0000 mg | ORAL_TABLET | Freq: Three times a day (TID) | ORAL | 0 refills | Status: DC | PRN

## 2021-05-08 MED ORDER — METHOCARBAMOL 500 MG PO TABS
1000.0000 mg | ORAL_TABLET | Freq: Once | ORAL | Status: AC
  Administered 2021-05-08: 1000 mg via ORAL
  Filled 2021-05-08: qty 2

## 2021-05-08 MED ORDER — PREDNISONE 20 MG PO TABS
60.0000 mg | ORAL_TABLET | Freq: Once | ORAL | Status: AC
  Administered 2021-05-08: 60 mg via ORAL
  Filled 2021-05-08: qty 3

## 2021-05-08 NOTE — ED Triage Notes (Signed)
Pt states that he has had lower back pain that is shooting down his left leg- pt was seen here for same yesterday and was told it was a pinched nerve- pt states the pain is worse, his urine is dark and has foul odor, and that he now has a knot in his L thigh from the IM medication given to him yesterday

## 2021-05-08 NOTE — Discharge Instructions (Signed)
You may alternate Tylenol 1000 mg every 6 hours as needed for pain, fever and Ibuprofen 800 mg every 8 hours as needed for pain, fever.  Please take Ibuprofen with food.  Do not take more than 4000 mg of Tylenol (acetaminophen) in a 24 hour period.   Steps to find a Primary Care Provider (PCP):  Call [phone] or [phone] to access "[facility] Find a Doctor Service."  2.  You may also go on the [facility] website at [email or web address]  

## 2021-05-08 NOTE — ED Provider Notes (Signed)
[facility] Emergency Department Provider Note  ____________________________________________   Event Date/Time   First MD Initiated Contact with Patient 05/08/21 2258     (approximate)  I have reviewed the triage vital signs and the nursing notes.   HISTORY  Chief Complaint Back Pain    HPI Kenneth Carey is a 40 y.o. male with history of IV heroin abuse who presents to the emergency department with complaints of diffuse lower back pain for the past 3 days.  Denies any known injury.  States pain will radiate down the left leg.  Denies numbness, tingling, focal weakness, bowel or bladder incontinence, urinary retention, fever.  States he has not used any IV drugs in a year.  No previous back surgeries, epidural injections.  Was seen in the emergency department recently for the same and received IM Norflex and Dilaudid.  States he has a "knot" in his left anterior thigh from one of the injections.  There is no redness, warmth of this area.  He is able to ambulate.  Does not have a primary care doctor.  He states he was concerned he could be dehydrated because his urine is "dark and stinks".  No dysuria, hematuria, discharge.  No vomiting or diarrhea.        Past Medical History:  Diagnosis Date  . Renal disorder    kidney stones    There are no problems to display for this patient.   History reviewed. No pertinent surgical history.  Prior to Admission medications   Medication Sig Start Date End Date Taking? Authorizing Provider  ibuprofen (ADVIL) 800 MG tablet Take 1 tablet (800 mg total) by mouth every 8 (eight) hours as needed for mild pain. 05/08/21  Yes Tene Gato, Layla Maw, DO  methocarbamol (ROBAXIN) 500 MG tablet Take 1-2 tablets (500-1,000 mg total) by mouth every 8 (eight) hours as needed for muscle spasms. 05/08/21  Yes Lainie Daubert, Layla Maw, DO  methylPREDNISolone (MEDROL DOSEPAK) 4 MG TBPK tablet Take as directed 05/08/21  Yes Riyad Keena, Layla Maw, DO   naloxone [facility]) nasal spray 4 mg/0.1 mL Apply to the nose if patient is not breathing or used too much heroin 11/08/16   Don Perking, [place], MD  orphenadrine (NORFLEX) 100 MG tablet Take 1 tablet (100 mg total) by mouth 2 (two) times daily. 05/07/21   Joni Reining, PA-C  oxyCODONE-acetaminophen (PERCOCET) 7.5-325 MG tablet Take 1 tablet by mouth every 6 (six) hours as needed for severe pain. 05/07/21   Joni Reining, PA-C    Allergies Toradol [ketorolac tromethamine] and Penicillins  No family history on file.  Social History Social History   Tobacco Use  . Smoking status: Current Every Day Smoker    Packs/day: 0.50  . Smokeless tobacco: Never Used  Substance Use Topics  . Alcohol use: Yes    Comment: occ  . Drug use: Yes    Types: Cocaine, Marijuana, Methamphetamines    Review of Systems Constitutional: No fever. Eyes: No visual changes. ENT: No sore throat. Cardiovascular: Denies chest pain. Respiratory: Denies shortness of breath. Gastrointestinal: No nausea, vomiting, diarrhea. Genitourinary: Negative for dysuria. Musculoskeletal: + for back pain. Skin: Negative for rash. Neurological: Negative for focal weakness or numbness.  ____________________________________________   PHYSICAL EXAM:  VITAL SIGNS: ED Triage Vitals  Enc Vitals Group     BP 05/08/21 1637 (!) 150/81     Pulse Rate 05/08/21 1637 (!) 104     Resp 05/08/21 1637 18     Temp  05/08/21 1637 98.4 F (36.9 C)     Temp Source 05/08/21 1637 Oral     SpO2 05/08/21 1637 96 %     Weight 05/08/21 1636 230 lb (104.3 kg)     Height 05/08/21 1636 5\' 10"  (1.778 m)     Head Circumference --      Peak Flow --      Pain Score 05/08/21 1634 9     Pain Loc --      Pain Edu? --      Excl. in GC? --    CONSTITUTIONAL: Alert and oriented and responds appropriately to questions. Well-appearing; well-nourished HEAD: Normocephalic EYES: Conjunctivae clear, pupils appear equal, EOM appear intact ENT: normal  nose; moist mucous membranes NECK: Supple, normal ROM CARD: RRR; S1 and S2 appreciated; no murmurs, no clicks, no rubs, no gallops RESP: Normal chest excursion without splinting or tachypnea; breath sounds clear and equal bilaterally; no wheezes, no rhonchi, no rales, no hypoxia or respiratory distress, speaking full sentences ABD/GI: Normal bowel sounds; non-distended; soft, non-tender, no rebound, no guarding, no peritoneal signs, no hepatosplenomegaly BACK: The back appears normal, tender diffusely over the lower lumbar spine without step-off or deformity, redness or warmth, ecchymosis or soft tissue swelling EXT: Normal ROM in all joints; no deformity noted, no edema; no cyanosis, bilateral anterior thighs appear normal without redness, warmth, swelling, hematoma, ecchymosis SKIN: Normal color for age and race; warm; no rash on exposed skin NEURO: Moves all extremities equally and ambulates with normal gait, normal sensation diffusely, no saddle anesthesia, 2+ deep tendon reflexes in bilateral lower extremities, strength 5/5 in bilateral lower extremities PSYCH: The patient's mood and manner are appropriate.  ____________________________________________   LABS (all labs ordered are listed, but only abnormal results are displayed)  Labs Reviewed  URINALYSIS, COMPLETE (UACMP) WITH MICROSCOPIC - Abnormal; Notable for the following components:      Result Value   Color, Urine YELLOW (*)    APPearance CLEAR (*)    Leukocytes,Ua TRACE (*)    All other components within normal limits  BASIC METABOLIC PANEL - Abnormal; Notable for the following components:   Potassium 3.4 (*)    Glucose, Bld 121 (*)    All other components within normal limits  CBC   ____________________________________________  EKG   ____________________________________________  RADIOLOGY I, Ramsay Bognar, personally viewed and evaluated these images (plain radiographs) as part of my medical decision making, as well  as reviewing the written report by the radiologist.  ED MD interpretation:    Official radiology report(s): No results found.  ____________________________________________   PROCEDURES  Procedure(s) performed (including Critical Care):  Procedures   ____________________________________________   INITIAL IMPRESSION / ASSESSMENT AND PLAN / ED COURSE  As part of my medical decision making, I reviewed the following data within the electronic MEDICAL RECORD NUMBER Nursing notes reviewed and incorporated, Labs reviewed , Old chart reviewed, Notes from prior ED visits and [place] Controlled Substance Database         Patient here with symptoms of lumbar radiculopathy.  He has no red flag symptoms.  Doubt cauda equina, spinal stenosis, epidural abscess or hematoma, discitis or osteomyelitis, transverse myelitis.  Had frank discussion with patient that given his history of IV drug abuse he would be high risk for infection but he is adamant that he has been clean for a year.  He is afebrile here and has no leukocytosis or leukopenia.  Labs reassuring.  He has not having any urinary symptoms.  His urine today shows 6-10 white blood cells but no other acute abnormality.  He was concerned he could be dehydrated however his vital signs, exam are reassuring and he has no ketones in his urine and is tolerating p.o.  Recommended increase fluid intake at home but I do not feel that he needs IV fluids at this time.  Will discharge with Robaxin, Medrol Dosepak, ibuprofen.  Given outpatient follow-up and discussed return precautions at length.  I do not feel he needs emergent imaging of his back at this time.  He did have x-rays 05/07/2021 which showed mild degenerative disc disease changes at L5-S1 without any other acute osseous abnormalities.  At this time, I do not feel there is any life-threatening condition present. I have reviewed, interpreted and discussed all results (EKG, imaging, lab, urine as appropriate)  and exam findings with patient/family. I have reviewed nursing notes and appropriate previous records.  I feel the patient is safe to be discharged home without further emergent workup and can continue workup as an outpatient as needed. Discussed usual and customary return precautions. Patient/family verbalize understanding and are comfortable with this plan.  Outpatient follow-up has been provided as needed. All questions have been answered.   ____________________________________________   FINAL CLINICAL IMPRESSION(S) / ED DIAGNOSES  Final diagnoses:  Lumbar radiculopathy     ED Discharge Orders         Ordered    ibuprofen (ADVIL) 800 MG tablet  Every 8 hours PRN        05/08/21 2340    methocarbamol (ROBAXIN) 500 MG tablet  Every 8 hours PRN        05/08/21 2340    methylPREDNISolone (MEDROL DOSEPAK) 4 MG TBPK tablet        05/08/21 2340          *Please note:  Kenneth Carey was evaluated in Emergency Department on 05/09/2021 for the symptoms described in the history of present illness. He was evaluated in the context of the global COVID-19 pandemic, which necessitated consideration that the patient might be at risk for infection with the SARS-CoV-2 virus that causes COVID-19. Institutional protocols and algorithms that pertain to the evaluation of patients at risk for COVID-19 are in a state of rapid change based on information released by regulatory bodies including the CDC and federal and state organizations. These policies and algorithms were followed during the patient's care in the ED.  Some ED evaluations and interventions may be delayed as a result of limited staffing during and the pandemic.*   Note:  This document was prepared using Dragon voice recognition software and may include unintentional dictation errors.   Adolphe Fortunato, Layla Maw, DO 05/09/21 [phone]

## 2021-06-14 ENCOUNTER — Encounter: Payer: Self-pay | Admitting: Medical Oncology

## 2021-06-14 ENCOUNTER — Other Ambulatory Visit: Payer: Self-pay

## 2021-06-14 ENCOUNTER — Emergency Department
Admission: EM | Admit: 2021-06-14 | Discharge: 2021-06-14 | Disposition: A | Discharge status: Home / Self Care | Payer: Self-pay | Attending: Emergency Medicine | Admitting: Emergency Medicine

## 2021-06-14 DIAGNOSIS — F17210 Nicotine dependence, cigarettes, uncomplicated: Secondary | ICD-10-CM | POA: Insufficient documentation

## 2021-06-14 DIAGNOSIS — I10 Essential (primary) hypertension: Secondary | ICD-10-CM

## 2021-06-14 MED ORDER — CHLORTHALIDONE 25 MG PO TABS: 25.0000 mg | ORAL_TABLET | Freq: Every day | ORAL | 1 refills | Status: DC

## 2021-06-14 NOTE — ED Triage Notes (Signed)
Pt reports that he was recently in [facility] for pneumonia, pt was there for 7 days, since being released pt has been reporting high bp. Yesterday it was 169/102. Pt reports headache with blurred vision.

## 2021-06-14 NOTE — ED Provider Notes (Signed)
[facility] Emergency Department Provider Note   ____________________________________________    I have reviewed the triage vital signs and the nursing notes.   HISTORY  Chief Complaint Hypertension     HPI Kenneth Carey is a 40 y.o. male who presents with complaints of high blood pressure.  Patient reports he was recently discharged from [facility] after being treated for pneumonia.  He reports there he was getting medication for high blood pressure which was very helpful.  Since he was discharged she has not had any medication and occasionally has a sensation that his blood pressure is elevated with mild headaches.  Currently feels well  Past Medical History:  Diagnosis Date   Renal disorder    kidney stones    There are no problems to display for this patient.   No past surgical history on file.  Prior to Admission medications   Medication Sig Start Date End Date Taking? Authorizing Provider  chlorthalidone (HYGROTON) 25 MG tablet Take 1 tablet (25 mg total) by mouth daily. 06/14/21  Yes Jene Every, MD  ibuprofen (ADVIL) 800 MG tablet Take 1 tablet (800 mg total) by mouth every 8 (eight) hours as needed for mild pain. 05/08/21   Ward, Layla Maw, DO  methocarbamol (ROBAXIN) 500 MG tablet Take 1-2 tablets (500-1,000 mg total) by mouth every 8 (eight) hours as needed for muscle spasms. 05/08/21   Ward, Layla Maw, DO  methylPREDNISolone (MEDROL DOSEPAK) 4 MG TBPK tablet Take as directed 05/08/21   Ward, Layla Maw, DO  naloxone [facility]) nasal spray 4 mg/0.1 mL Apply to the nose if patient is not breathing or used too much heroin 11/08/16   Don Perking, [place], MD  orphenadrine (NORFLEX) 100 MG tablet Take 1 tablet (100 mg total) by mouth 2 (two) times daily. 05/07/21   Joni Reining, PA-C  oxyCODONE-acetaminophen (PERCOCET) 7.5-325 MG tablet Take 1 tablet by mouth every 6 (six) hours as needed for severe pain. 05/07/21   Joni Reining, PA-C      Allergies Toradol [ketorolac tromethamine] and Penicillins  No family history on file.  Social History Social History   Tobacco Use   Smoking status: Every Day    Packs/day: 0.50    Pack years: 0.00    Types: Cigarettes   Smokeless tobacco: Never  Substance Use Topics   Alcohol use: Yes    Comment: occ   Drug use: Yes    Types: Cocaine, Marijuana, Methamphetamines    Review of Systems  Constitutional: No fever/chills  ENT: No sore throat.   Gastrointestinal: No abdominal pain.  No nausea, no vomiting.   Genitourinary: Negative for dysuria. Musculoskeletal: Negative for back pain. Skin: Negative for rash. Neurological: Negative for headaches     ____________________________________________   PHYSICAL EXAM:  VITAL SIGNS: ED Triage Vitals  Enc Vitals Group     BP 06/14/21 1229 (!) 168/98     Pulse Rate 06/14/21 1229 94     Resp 06/14/21 1229 18     Temp 06/14/21 1229 98.1 F (36.7 C)     Temp Source 06/14/21 1229 Oral     SpO2 06/14/21 1229 95 %     Weight 06/14/21 1230 111.1 kg (245 lb)     Height 06/14/21 1230 1.778 m (5\' 10" )     Head Circumference --      Peak Flow --      Pain Score 06/14/21 1229 7     Pain Loc --  Pain Edu? --      Excl. in GC? --      Constitutional: Alert and oriented. No acute distress. Pleasant and interactive Eyes: Conjunctivae are normal.  Head: Atraumatic. Nose: No congestion/rhinnorhea. Mouth/Throat: Mucous membranes are moist.   Cardiovascular: Normal rate, regular rhythm.  Respiratory: Normal respiratory effort.  No retractions.  Musculoskeletal: No lower extremity tenderness nor edema.   Neurologic:  Normal speech and language. No gross focal neurologic deficits are appreciated.   Skin:  Skin is warm, dry and intact. No rash noted.   ____________________________________________   LABS (all labs ordered are listed, but only abnormal results are displayed)  Labs Reviewed - No data to  display ____________________________________________  EKG   ____________________________________________  RADIOLOGY   ____________________________________________   PROCEDURES  Procedure(s) performed: No  Procedures   Critical Care performed: No ____________________________________________   INITIAL IMPRESSION / ASSESSMENT AND PLAN / ED COURSE  Pertinent labs & imaging results that were available during my care of the patient were reviewed by me and considered in my medical decision making (see chart for details).   Patient well-appearing and in no acute distress, blood pressure mildly elevated, asymptomatic at this time.  We will start the patient on chlorthalidone, outpatient follow-up arranged by [facility]   ____________________________________________   FINAL CLINICAL IMPRESSION(S) / ED DIAGNOSES  Final diagnoses:  Primary hypertension      NEW MEDICATIONS STARTED DURING THIS VISIT:  New Prescriptions   CHLORTHALIDONE (HYGROTON) 25 MG TABLET    Take 1 tablet (25 mg total) by mouth daily.     Note:  This document was prepared using Dragon voice recognition software and may include unintentional dictation errors.    Jene Every, MD 06/14/21 1255

## 2021-06-15 ENCOUNTER — Other Ambulatory Visit: Payer: Self-pay

## 2021-06-15 ENCOUNTER — Emergency Department
Admission: EM | Admit: 2021-06-15 | Discharge: 2021-06-15 | Disposition: A | Discharge status: Home / Self Care | Payer: Self-pay | Attending: Emergency Medicine | Admitting: Emergency Medicine

## 2021-06-15 DIAGNOSIS — F10230 Alcohol dependence with withdrawal, uncomplicated: Secondary | ICD-10-CM

## 2021-06-15 DIAGNOSIS — R0789 Other chest pain: Secondary | ICD-10-CM | POA: Insufficient documentation

## 2021-06-15 DIAGNOSIS — F10930 Alcohol use, unspecified with withdrawal, uncomplicated: Secondary | ICD-10-CM

## 2021-06-15 DIAGNOSIS — R000 Tachycardia, unspecified: Secondary | ICD-10-CM | POA: Insufficient documentation

## 2021-06-15 DIAGNOSIS — F17210 Nicotine dependence, cigarettes, uncomplicated: Secondary | ICD-10-CM | POA: Insufficient documentation

## 2021-06-15 DIAGNOSIS — R0602 Shortness of breath: Secondary | ICD-10-CM | POA: Insufficient documentation

## 2021-06-15 DIAGNOSIS — F10239 Alcohol dependence with withdrawal, unspecified: Secondary | ICD-10-CM | POA: Insufficient documentation

## 2021-06-15 DIAGNOSIS — R61 Generalized hyperhidrosis: Secondary | ICD-10-CM | POA: Insufficient documentation

## 2021-06-15 LAB — CBC
HCT: 50.5 % (ref 39.0–52.0)
Hemoglobin: 17.2 g/dL — ABNORMAL HIGH (ref 13.0–17.0)
MCH: 32.9 pg (ref 26.0–34.0)
MCHC: 34.1 g/dL (ref 30.0–36.0)
MCV: 96.6 fL (ref 80.0–100.0)
Platelets: 415 10*3/uL — ABNORMAL HIGH (ref 150–400)
RBC: 5.23 MIL/uL (ref 4.22–5.81)
RDW: 12.7 % (ref 11.5–15.5)
WBC: 13.7 10*3/uL — ABNORMAL HIGH (ref 4.0–10.5)
nRBC: 0 % (ref 0.0–0.2)

## 2021-06-15 LAB — COMPREHENSIVE METABOLIC PANEL
ALT: 122 U/L — ABNORMAL HIGH (ref 0–44)
AST: 89 U/L — ABNORMAL HIGH (ref 15–41)
Albumin: 4.6 g/dL (ref 3.5–5.0)
Alkaline Phosphatase: 85 U/L (ref 38–126)
Anion gap: 14 (ref 5–15)
BUN: 16 mg/dL (ref 6–20)
CO2: 23 mmol/L (ref 22–32)
Calcium: 9.9 mg/dL (ref 8.9–10.3)
Chloride: 96 mmol/L — ABNORMAL LOW (ref 98–111)
Creatinine, Ser: 0.93 mg/dL (ref 0.61–1.24)
GFR, Estimated: >60 mL/min (ref >60)
Glucose, Bld: 140 mg/dL — ABNORMAL HIGH (ref 70–99)
Potassium: 4.3 mmol/L (ref 3.5–5.1)
Sodium: 133 mmol/L — ABNORMAL LOW (ref 135–145)
Total Bilirubin: 1.1 mg/dL (ref 0.3–1.2)
Total Protein: 8.7 g/dL — ABNORMAL HIGH (ref 6.5–8.1)

## 2021-06-15 LAB — TROPONIN I (HIGH SENSITIVITY): Troponin I (High Sensitivity): 6 ng/L (ref <18)

## 2021-06-15 LAB — D-DIMER, QUANTITATIVE: D-Dimer, Quant: <0.27 ug/mL-FEU (ref 0.00–0.50)

## 2021-06-15 MED ORDER — SODIUM CHLORIDE 0.9 % IV BOLUS
1000.0000 mL | Freq: Once | INTRAVENOUS | Status: AC
  Administered 2021-06-15: 1000 mL via INTRAVENOUS

## 2021-06-15 MED ORDER — LORAZEPAM 1 MG PO TABS: 1.0000 mg | ORAL_TABLET | Freq: Once | ORAL | Status: DC

## 2021-06-15 MED ORDER — CHLORDIAZEPOXIDE HCL 25 MG PO CAPS: ORAL_CAPSULE | ORAL | 0 refills | Status: DC

## 2021-06-15 MED ORDER — LORAZEPAM 2 MG/ML IJ SOLN
1.0000 mg | Freq: Once | INTRAMUSCULAR | Status: AC
  Administered 2021-06-15: 1 mg via INTRAVENOUS
  Filled 2021-06-15: qty 1

## 2021-06-15 NOTE — ED Notes (Signed)
Pt placed in rm 12 and placed on cardiac monitor. MD Paducowski at bedside.

## 2021-06-15 NOTE — ED Triage Notes (Addendum)
Pt comes with c/o CP and tightness in chest. Pt states he was seen here yesterday for his BP. Pt states he took BP meds as prescribed. Pt states headache and feels sweaty.  Pt states some SOB  Pt is extremely diaphoretic and labored breathing.  Pt does admit to alcohol and lots of cocaine 3 days ago.

## 2021-06-15 NOTE — ED Provider Notes (Signed)
[facility] Emergency Department Provider Note  Time seen: 12:08 PM  I have reviewed the triage vital signs and the nursing notes.   HISTORY  Chief Complaint Chest Pain   HPI Kenneth Carey is a 40 y.o. male with a past medical history of hypertension, alcohol use, presents to the emergency department for chest tightness shortness of breath diaphoresis and tachycardia.  According to the patient he feels like his heart is racing for the past several days, states he is having tightness in his chest and shortness of breath today.  Patient is moderately diaphoretic upon arrival.  Patient admits to significant alcohol use but states his last drink was several days ago.  Patient states he was admitted recently to [facility] for pneumonia and alcohol withdrawal.  Patient states he also has anxiety and has panic attacks.  Tried his hydroxyzine at home without relief so came to the emergency department.  Past Medical History:  Diagnosis Date   Renal disorder    kidney stones    There are no problems to display for this patient.   History reviewed. No pertinent surgical history.  Prior to Admission medications   Medication Sig Start Date End Date Taking? Authorizing Provider  chlorthalidone (HYGROTON) 25 MG tablet Take 1 tablet (25 mg total) by mouth daily. 06/14/21   Jene Every, MD  ibuprofen (ADVIL) 800 MG tablet Take 1 tablet (800 mg total) by mouth every 8 (eight) hours as needed for mild pain. 05/08/21   Ward, Layla Maw, DO  methocarbamol (ROBAXIN) 500 MG tablet Take 1-2 tablets (500-1,000 mg total) by mouth every 8 (eight) hours as needed for muscle spasms. 05/08/21   Ward, Layla Maw, DO  methylPREDNISolone (MEDROL DOSEPAK) 4 MG TBPK tablet Take as directed 05/08/21   Ward, Layla Maw, DO  naloxone [employer]) nasal spray 4 mg/0.1 mL Apply to the nose if patient is not breathing or used too much heroin 11/08/16   Don Perking, [place], MD  orphenadrine (NORFLEX) 100 MG tablet  Take 1 tablet (100 mg total) by mouth 2 (two) times daily. 05/07/21   Joni Reining, PA-C  oxyCODONE-acetaminophen (PERCOCET) 7.5-325 MG tablet Take 1 tablet by mouth every 6 (six) hours as needed for severe pain. 05/07/21   Joni Reining, PA-C    Allergies  Allergen Reactions   Toradol [Ketorolac Tromethamine] Hives   Penicillins Anxiety    No family history on file.  Social History Social History   Tobacco Use   Smoking status: Every Day    Packs/day: 0.50    Pack years: 0.00    Types: Cigarettes   Smokeless tobacco: Never  Substance Use Topics   Alcohol use: Yes    Comment: 3 days ago   Drug use: Yes    Types: Cocaine    Comment: 3 days ago    Review of Systems Constitutional: Negative for fever. Cardiovascular: Positive for chest tightness.  Palpitations. Respiratory: Mild shortness of breath Gastrointestinal: Negative for abdominal pain Genitourinary: Negative for urinary compaints Musculoskeletal: Negative for musculoskeletal complaints Skin: Diaphoresis Neurological: Negative for headache All other ROS negative  ____________________________________________   PHYSICAL EXAM:  VITAL SIGNS: ED Triage Vitals  Enc Vitals Group     BP 06/15/21 1147 (!) 163/83     Pulse Rate 06/15/21 1147 (!) 125     Resp 06/15/21 1147 (!) 24     Temp 06/15/21 1147 98.8 F (37.1 C)     Temp src --      SpO2  06/15/21 1147 96 %     Weight --      Height --      Head Circumference --      Peak Flow --      Pain Score 06/15/21 1142 4     Pain Loc --      Pain Edu? --      Excl. in GC? --    Constitutional: Alert and oriented.  Patient is anxious appearing and moderately diaphoretic. Eyes: Normal exam ENT      Head: Normocephalic and atraumatic.      Mouth/Throat: Mucous membranes are moist. Cardiovascular: Regular rhythm rate around 120 bpm.  No obvious murmur. Respiratory: Mild tachypnea but normal respiratory effort.  Clear lung sounds bilaterally. Gastrointestinal:  Soft and nontender. No distention.   Musculoskeletal: Nontender with normal range of motion in all extremities. No lower extremity tenderness or edema. Neurologic:  Normal speech and language. No gross focal neurologic deficits  Skin:  Skin is warm.  Mild to moderate diaphoresis. Psychiatric: Anxious in appearance.  ____________________________________________    EKG  EKG viewed and interpreted by myself shows sinus tachycardia 121 bpm with a narrow QRS, normal axis, normal intervals, no concerning ST changes.  ____________________________________________   INITIAL IMPRESSION / ASSESSMENT AND PLAN / ED COURSE  Pertinent labs & imaging results that were available during my care of the patient were reviewed by me and considered in my medical decision making (see chart for details).   Patient presents to the emergency department for chest tightness shortness of breath diaphoresis.  Patient is moderately diaphoretic, appears anxious is tachycardic with mild tachypnea.  We will check labs including a D-dimer as a precaution and a troponin.  We will treat with IV Ativan and IV fluids.  Differential would include ACS, PE, alcohol withdrawal, panic attack/anxiety.  Patient's work-up is overall reassuring.  Patient's heart rate is now around 95 bpm during my evaluation.  No longer diaphoretic.  Patient's D-dimer and troponin are negative.  Highly suspect alcohol withdrawal to be contributing to the patient's symptoms.  I had a long discussion with the patient regarding alcohol use as well as a trial of Librium.  Patient is agreeable to plan of care.  Discussed Librium use as well as avoiding alcohol.  Discussed return precautions.  Kenneth Carey was evaluated in Emergency Department on 06/15/2021 for the symptoms described in the history of present illness. He was evaluated in the context of the global COVID-19 pandemic, which necessitated consideration that the patient might be at risk for  infection with the SARS-CoV-2 virus that causes COVID-19. Institutional protocols and algorithms that pertain to the evaluation of patients at risk for COVID-19 are in a state of rapid change based on information released by regulatory bodies including the CDC and federal and state organizations. These policies and algorithms were followed during the patient's care in the ED.  ____________________________________________   FINAL CLINICAL IMPRESSION(S) / ED DIAGNOSES  Tachycardia Alcohol withdrawal   Minna Antis, MD 06/15/21 1429

## 2021-06-15 NOTE — ED Notes (Signed)
pateint in hall says to take his IV out because he has to leave by 3pm.  We took his IV out. I went to get instructions and brought them to the room.  I started to explain instructions and prescription and he said this hospital is a bunch of quacks and said that was not his diagnosis.  He left wtihtout the papers.

## 2024-03-04 DEATH — deceased
